# Patient Record
Sex: Female | Born: 1935 | Race: White | Hispanic: No | Smoking: Never smoker
Health system: Southern US, Community
[De-identification: ages and names within clinical notes are randomized; demographics above are authoritative.]

## PROBLEM LIST (undated history)

## (undated) DIAGNOSIS — I10 Essential (primary) hypertension: Secondary | ICD-10-CM

## (undated) DIAGNOSIS — N289 Disorder of kidney and ureter, unspecified: Secondary | ICD-10-CM

## (undated) DIAGNOSIS — G2581 Restless legs syndrome: Secondary | ICD-10-CM

## (undated) DIAGNOSIS — D649 Anemia, unspecified: Secondary | ICD-10-CM

## (undated) DIAGNOSIS — I509 Heart failure, unspecified: Secondary | ICD-10-CM

---

## 2014-09-19 ENCOUNTER — Other Ambulatory Visit
Admission: RE | Admit: 2014-09-19 | Discharge: 2014-09-19 | Disposition: A | Discharge status: Home / Self Care | Payer: Medicare Other | Source: Ambulatory Visit | Attending: Physician Assistant | Admitting: Physician Assistant

## 2014-09-19 DIAGNOSIS — R300 Dysuria: Secondary | ICD-10-CM | POA: Insufficient documentation

## 2014-09-19 LAB — URINALYSIS COMPLETE WITH MICROSCOPIC (ARMC ONLY)
Bilirubin Urine: NEGATIVE
GLUCOSE, UA: NEGATIVE mg/dL
HGB URINE DIPSTICK: NEGATIVE
Ketones, ur: NEGATIVE mg/dL
LEUKOCYTES UA: NEGATIVE
NITRITE: NEGATIVE
Protein, ur: 100 mg/dL — AB
SPECIFIC GRAVITY, URINE: 1.01 (ref 1.005–1.030)
pH: 5 (ref 5.0–8.0)

## 2014-11-28 ENCOUNTER — Other Ambulatory Visit
Admission: RE | Admit: 2014-11-28 | Discharge: 2014-11-28 | Disposition: A | Discharge status: Home / Self Care | Payer: Medicare Other | Source: Ambulatory Visit | Attending: Nurse Practitioner | Admitting: Nurse Practitioner

## 2014-11-28 DIAGNOSIS — R4182 Altered mental status, unspecified: Secondary | ICD-10-CM | POA: Diagnosis present

## 2014-11-28 LAB — CBC WITH DIFFERENTIAL/PLATELET
BASOS ABS: 0.1 10*3/uL (ref 0–0.1)
Basophils Relative: 1 %
Eosinophils Absolute: 0.4 10*3/uL (ref 0–0.7)
Eosinophils Relative: 4 %
HCT: 34.2 % — ABNORMAL LOW (ref 35.0–47.0)
Hemoglobin: 11.4 g/dL — ABNORMAL LOW (ref 12.0–16.0)
LYMPHS ABS: 2.4 10*3/uL (ref 1.0–3.6)
LYMPHS PCT: 25 %
MCH: 31.9 pg (ref 26.0–34.0)
MCHC: 33.3 g/dL (ref 32.0–36.0)
MCV: 95.8 fL (ref 80.0–100.0)
Monocytes Absolute: 0.6 10*3/uL (ref 0.2–0.9)
Monocytes Relative: 6 %
NEUTROS PCT: 64 %
Neutro Abs: 6.2 10*3/uL (ref 1.4–6.5)
PLATELETS: 249 10*3/uL (ref 150–440)
RBC: 3.57 MIL/uL — ABNORMAL LOW (ref 3.80–5.20)
RDW: 17.9 % — AB (ref 11.5–14.5)
WBC: 9.7 10*3/uL (ref 3.6–11.0)

## 2014-11-28 LAB — COMPREHENSIVE METABOLIC PANEL
ALBUMIN: 4 g/dL (ref 3.5–5.0)
ALT: 19 U/L (ref 14–54)
ANION GAP: 12 (ref 5–15)
AST: 31 U/L (ref 15–41)
Alkaline Phosphatase: 69 U/L (ref 38–126)
BUN: 48 mg/dL — ABNORMAL HIGH (ref 6–20)
CO2: 27 mmol/L (ref 22–32)
Calcium: 9 mg/dL (ref 8.9–10.3)
Chloride: 95 mmol/L — ABNORMAL LOW (ref 101–111)
Creatinine, Ser: 2.09 mg/dL — ABNORMAL HIGH (ref 0.44–1.00)
GFR calc Af Amer: 23 mL/min — ABNORMAL LOW (ref >60)
GFR calc non Af Amer: 19 mL/min — ABNORMAL LOW (ref >60)
GLUCOSE: 101 mg/dL — AB (ref 65–99)
Potassium: 4.4 mmol/L (ref 3.5–5.1)
SODIUM: 134 mmol/L — AB (ref 135–145)
Total Bilirubin: 0.4 mg/dL (ref 0.3–1.2)
Total Protein: 7.7 g/dL (ref 6.5–8.1)

## 2014-12-04 ENCOUNTER — Other Ambulatory Visit
Admission: RE | Admit: 2014-12-04 | Discharge: 2014-12-04 | Disposition: A | Discharge status: Home / Self Care | Payer: Medicare Other | Source: Ambulatory Visit | Attending: Nurse Practitioner | Admitting: Nurse Practitioner

## 2014-12-04 DIAGNOSIS — R4182 Altered mental status, unspecified: Secondary | ICD-10-CM | POA: Diagnosis present

## 2014-12-04 LAB — URINALYSIS COMPLETE WITH MICROSCOPIC (ARMC ONLY)
Bilirubin Urine: NEGATIVE
Glucose, UA: NEGATIVE mg/dL
Ketones, ur: NEGATIVE mg/dL
Nitrite: NEGATIVE
Protein, ur: >300 mg/dL — AB
SQUAMOUS EPITHELIAL / LPF: NONE SEEN — AB
Specific Gravity, Urine: 1.025 (ref 1.005–1.030)
pH: 5.5 (ref 5.0–8.0)

## 2014-12-06 LAB — URINE CULTURE: Culture: >=100000

## 2014-12-12 ENCOUNTER — Other Ambulatory Visit
Admission: RE | Admit: 2014-12-12 | Discharge: 2014-12-12 | Disposition: A | Discharge status: Home / Self Care | Payer: BLUE CROSS/BLUE SHIELD | Source: Ambulatory Visit | Attending: Nurse Practitioner | Admitting: Nurse Practitioner

## 2014-12-12 DIAGNOSIS — I509 Heart failure, unspecified: Secondary | ICD-10-CM | POA: Insufficient documentation

## 2014-12-12 LAB — CBC WITH DIFFERENTIAL/PLATELET
Basophils Absolute: 0.1 10*3/uL (ref 0–0.1)
Basophils Relative: 1 %
EOS PCT: 1 %
Eosinophils Absolute: 0.1 10*3/uL (ref 0–0.7)
HEMATOCRIT: 34.3 % — AB (ref 35.0–47.0)
HEMOGLOBIN: 11.6 g/dL — AB (ref 12.0–16.0)
LYMPHS ABS: 1.6 10*3/uL (ref 1.0–3.6)
Lymphocytes Relative: 17 %
MCH: 32.6 pg (ref 26.0–34.0)
MCHC: 34 g/dL (ref 32.0–36.0)
MCV: 96.1 fL (ref 80.0–100.0)
Monocytes Absolute: 0.5 10*3/uL (ref 0.2–0.9)
Monocytes Relative: 6 %
NEUTROS ABS: 7 10*3/uL — AB (ref 1.4–6.5)
Neutrophils Relative %: 75 %
Platelets: 244 10*3/uL (ref 150–440)
RBC: 3.57 MIL/uL — ABNORMAL LOW (ref 3.80–5.20)
RDW: 16.7 % — ABNORMAL HIGH (ref 11.5–14.5)
WBC: 9.3 10*3/uL (ref 3.6–11.0)

## 2014-12-12 LAB — COMPREHENSIVE METABOLIC PANEL
ALT: 16 U/L (ref 14–54)
AST: 29 U/L (ref 15–41)
Albumin: 4.1 g/dL (ref 3.5–5.0)
Alkaline Phosphatase: 62 U/L (ref 38–126)
Anion gap: 10 (ref 5–15)
BUN: 52 mg/dL — ABNORMAL HIGH (ref 6–20)
CO2: 27 mmol/L (ref 22–32)
CREATININE: 2.21 mg/dL — AB (ref 0.44–1.00)
Calcium: 9.1 mg/dL (ref 8.9–10.3)
Chloride: 99 mmol/L — ABNORMAL LOW (ref 101–111)
GFR calc Af Amer: 21 mL/min — ABNORMAL LOW (ref >60)
GFR calc non Af Amer: 18 mL/min — ABNORMAL LOW (ref >60)
Glucose, Bld: 98 mg/dL (ref 65–99)
POTASSIUM: 3.9 mmol/L (ref 3.5–5.1)
SODIUM: 136 mmol/L (ref 135–145)
TOTAL PROTEIN: 7.8 g/dL (ref 6.5–8.1)
Total Bilirubin: 0.7 mg/dL (ref 0.3–1.2)

## 2014-12-15 ENCOUNTER — Encounter: Payer: Self-pay | Admitting: Emergency Medicine

## 2014-12-15 ENCOUNTER — Emergency Department
Admission: EM | Admit: 2014-12-15 | Discharge: 2014-12-15 | Disposition: A | Discharge status: Home / Self Care | Payer: Medicare Other | Attending: Student | Admitting: Student

## 2014-12-15 ENCOUNTER — Emergency Department: Payer: Medicare Other

## 2014-12-15 ENCOUNTER — Other Ambulatory Visit: Payer: Self-pay

## 2014-12-15 DIAGNOSIS — E860 Dehydration: Secondary | ICD-10-CM | POA: Diagnosis not present

## 2014-12-15 DIAGNOSIS — R001 Bradycardia, unspecified: Secondary | ICD-10-CM | POA: Insufficient documentation

## 2014-12-15 DIAGNOSIS — N183 Chronic kidney disease, stage 3 (moderate): Secondary | ICD-10-CM | POA: Insufficient documentation

## 2014-12-15 DIAGNOSIS — I129 Hypertensive chronic kidney disease with stage 1 through stage 4 chronic kidney disease, or unspecified chronic kidney disease: Secondary | ICD-10-CM | POA: Insufficient documentation

## 2014-12-15 DIAGNOSIS — R4182 Altered mental status, unspecified: Secondary | ICD-10-CM | POA: Diagnosis present

## 2014-12-15 HISTORY — DX: Anemia, unspecified: D64.9

## 2014-12-15 HISTORY — DX: Restless legs syndrome: G25.81

## 2014-12-15 HISTORY — DX: Heart failure, unspecified: I50.9

## 2014-12-15 HISTORY — DX: Essential (primary) hypertension: I10

## 2014-12-15 HISTORY — DX: Disorder of kidney and ureter, unspecified: N28.9

## 2014-12-15 LAB — CBC WITH DIFFERENTIAL/PLATELET
BASOS PCT: 1 %
Basophils Absolute: 0 10*3/uL (ref 0–0.1)
EOS ABS: 0.3 10*3/uL (ref 0–0.7)
Eosinophils Relative: 4 %
HCT: 34.7 % — ABNORMAL LOW (ref 35.0–47.0)
Hemoglobin: 11.7 g/dL — ABNORMAL LOW (ref 12.0–16.0)
LYMPHS ABS: 2.3 10*3/uL (ref 1.0–3.6)
Lymphocytes Relative: 29 %
MCH: 32.5 pg (ref 26.0–34.0)
MCHC: 33.8 g/dL (ref 32.0–36.0)
MCV: 96.2 fL (ref 80.0–100.0)
MONOS PCT: 8 %
Monocytes Absolute: 0.7 10*3/uL (ref 0.2–0.9)
Neutro Abs: 4.7 10*3/uL (ref 1.4–6.5)
Neutrophils Relative %: 58 %
Platelets: 232 10*3/uL (ref 150–440)
RBC: 3.61 MIL/uL — ABNORMAL LOW (ref 3.80–5.20)
RDW: 17.2 % — ABNORMAL HIGH (ref 11.5–14.5)
WBC: 8 10*3/uL (ref 3.6–11.0)

## 2014-12-15 LAB — COMPREHENSIVE METABOLIC PANEL
ALK PHOS: 64 U/L (ref 38–126)
ALT: 18 U/L (ref 14–54)
AST: 38 U/L (ref 15–41)
Albumin: 4.1 g/dL (ref 3.5–5.0)
Anion gap: 10 (ref 5–15)
BUN: 54 mg/dL — ABNORMAL HIGH (ref 6–20)
CO2: 24 mmol/L (ref 22–32)
CREATININE: 2.35 mg/dL — AB (ref 0.44–1.00)
Calcium: 9.3 mg/dL (ref 8.9–10.3)
Chloride: 97 mmol/L — ABNORMAL LOW (ref 101–111)
GFR calc Af Amer: 20 mL/min — ABNORMAL LOW (ref >60)
GFR, EST NON AFRICAN AMERICAN: 17 mL/min — AB (ref >60)
Glucose, Bld: 97 mg/dL (ref 65–99)
POTASSIUM: 3.5 mmol/L (ref 3.5–5.1)
Sodium: 131 mmol/L — ABNORMAL LOW (ref 135–145)
TOTAL PROTEIN: 7.7 g/dL (ref 6.5–8.1)
Total Bilirubin: 0.2 mg/dL — ABNORMAL LOW (ref 0.3–1.2)

## 2014-12-15 LAB — URINALYSIS COMPLETE WITH MICROSCOPIC (ARMC ONLY)
Bilirubin Urine: NEGATIVE
Glucose, UA: NEGATIVE mg/dL
HGB URINE DIPSTICK: NEGATIVE
Ketones, ur: NEGATIVE mg/dL
Nitrite: NEGATIVE
Protein, ur: 30 mg/dL — AB
RBC / HPF: NONE SEEN RBC/hpf (ref 0–5)
SPECIFIC GRAVITY, URINE: 1.006 (ref 1.005–1.030)
pH: 5 (ref 5.0–8.0)

## 2014-12-15 LAB — TROPONIN I

## 2014-12-15 MED ORDER — SODIUM CHLORIDE 0.9 % IV BOLUS (SEPSIS)
250.0000 mL | Freq: Once | INTRAVENOUS | Status: AC
  Administered 2014-12-15: 250 mL via INTRAVENOUS

## 2014-12-15 NOTE — ED Notes (Signed)
Pt comes in via EMS c/o altered mental status x 1 week.  Patient is from [place].  They explained that she was being treated for a UTI last week and has been on antibiotics.  Facility drew blood work earlier this week and per EMS they stated "she was dry".  Patient A&O to person and time.

## 2014-12-15 NOTE — ED Notes (Signed)
MD at bedside. 

## 2014-12-15 NOTE — ED Notes (Signed)
Patient transported to X-ray 

## 2014-12-15 NOTE — ED Provider Notes (Addendum)
[facility] Emergency Department Provider Note  ____________________________________________  Time seen: Approximately 5:04 PM  I have reviewed the triage vital signs and the nursing notes.   HISTORY  Chief Complaint Altered Mental Status  Caveat-history of present illness and review of systems Limited secondary to the patient not knowing why she was sent to the emergency department from [facility]. Partial history and review of systems obtained from EMS on their arrival and staff at [facility].  HPI [name] [name] is a 81 y.o. female with history of anemia, hypertension, CHF, stage III CKD, restless leg syndrome resents from [place] [facility] for evaluation of "confusion" and "altered mental status". Per EMS report, the patient was recently treated for urinary tract infection last week with ciprofloxacin which she is still taking. Staff reports to me that the patient's physician came in to check on her today injury labs and that she "looked really dry on her lab work". Also reports that the patient was repeating "I'm all right" today and that she has been somewhat irritable recently not wanting to take her iron pills because "I don't need it". She has had no fevers, no chills, no vomiting,  diarrhea, cough, or recent fevers. No recent fall. Denies any chest pain or difficulty breathing. She did not want to come to the emergency department today.   Past Medical History  Diagnosis Date  . Anemia   . Restless leg syndrome   . Hypertension   . CHF (congestive heart failure)   . Renal disorder     CKD stage 3    There are no active problems to display for this patient.   History reviewed. No pertinent past surgical history.  No current outpatient prescriptions on file.  Allergies Codeine  No family history on file.  Social History History  Substance Use Topics  . Smoking status: Never Smoker   . Smokeless tobacco: Not on file  . Alcohol Use: No     Review of Systems Constitutional: No fever/chills Eyes: No visual changes. ENT: No sore throat. Cardiovascular: Denies chest pain. Respiratory: Denies shortness of breath. Gastrointestinal: No abdominal pain.  No nausea, no vomiting.  No diarrhea.  No constipation. Genitourinary: Negative for dysuria. Musculoskeletal: Negative for back pain. Skin: Negative for rash. Neurological: Negative for headaches, focal weakness or numbness.  10-point ROS otherwise negative.   Caveat-history of present illness and review of systems Limited secondary to the patient not knowing why she was sent to the emergency department from [facility]. Partial history and review of systems obtained from EMS on their arrival. ____________________________________________   PHYSICAL EXAM:  VITAL SIGNS: ED Triage Vitals  Enc Vitals Group     BP --      Pulse Rate 12/15/14 1659 54     Resp 12/15/14 1659 12     Temp 12/15/14 1659 97.6 F (36.4 C)     Temp Source 12/15/14 1659 Oral     SpO2 12/15/14 1659 100 %     Weight 12/15/14 1659 178 lb 14.4 oz (81.149 kg)     Height 12/15/14 1659 5\' 4"  (1.626 m)     Head Cir --      Peak Flow --      Pain Score --      Pain Loc --      Pain Edu? --      Excl. in GC? --     Constitutional: Alert and oriented x3. Well appearing and in no acute distress. Eyes: Conjunctivae  are normal. PERRL. EOMI. Head: Atraumatic. Nose: No congestion/rhinnorhea. Mouth/Throat: Mucous membranes are moist.  Oropharynx non-erythematous. Neck: No stridor.   Cardiovascular: mildly bradycardic rate, regular rhythm. Grossly normal heart sounds.  Good peripheral circulation. Respiratory: Normal respiratory effort.  No retractions. Lungs CTAB. Gastrointestinal: Soft and nontender. No distention. No abdominal bruits. No CVA tenderness. Genitourinary: deferred Musculoskeletal: No lower extremity tenderness nor edema.  No joint effusions. Neurologic:  Normal speech and language. No  gross focal neurologic deficits are appreciated. 5 out of 5 strength in bilateral upper and lower extremities, sensation intact to light touch throughout. Skin:  Skin is warm, dry and intact. No rash noted. Psychiatric: Mood and affect are normal. Speech and behavior are normal.  ____________________________________________   LABS (all labs ordered are listed, but only abnormal results are displayed)  Labs Reviewed  CBC WITH DIFFERENTIAL/PLATELET - Abnormal; Notable for the following:    RBC 3.61 (*)    Hemoglobin 11.7 (*)    HCT 34.7 (*)    RDW 17.2 (*)    All other components within normal limits  COMPREHENSIVE METABOLIC PANEL - Abnormal; Notable for the following:    Sodium 131 (*)    Chloride 97 (*)    BUN 54 (*)    Creatinine, Ser 2.35 (*)    Total Bilirubin 0.2 (*)    GFR calc non Af Amer 17 (*)    GFR calc Af Amer 20 (*)    All other components within normal limits  URINALYSIS COMPLETEWITH MICROSCOPIC ([facility] ONLY) - Abnormal; Notable for the following:    Color, Urine STRAW (*)    APPearance CLEAR (*)    Protein, ur 30 (*)    Leukocytes, UA TRACE (*)    Bacteria, UA RARE (*)    Squamous Epithelial / LPF 0-5 (*)    All other components within normal limits  TROPONIN I   ____________________________________________  EKG  ED ECG REPORT I, Gayla Doss, the attending physician, personally viewed and interpreted this ECG.   Date: 12/15/2014  EKG Time: 16:47  Rate: 47  Rhythm: sinus bradycardia with sinus arrhythmia  Axis: normal  Intervals:left bundle branch block, incomplete  ST&T Change: No acute ST segment elevation  ____________________________________________  RADIOLOGY  CXR  FINDINGS: The lungs are clear. Heart size is normal. No pneumothorax or pleural effusion. Hiatal hernia is noted.  IMPRESSION: No acute disease.  Hiatal hernia.  ____________________________________________   PROCEDURES  Procedure(s) performed: None  Critical Care  performed: No  ____________________________________________   INITIAL IMPRESSION / ASSESSMENT AND PLAN / ED COURSE  Pertinent labs & imaging results that were available during my care of the patient were reviewed by me and considered in my medical decision making (see chart for details).  [name] [name] is a 81 y.o. female with history of anemia, hypertension, CHF, stage III CKD, restless leg syndrome resents from [place] [facility] for evaluation of "confusion" and "altered mental status". On arrival, the patient is alert and oriented 3. She is only confused because she was not told why she was being sent to the emergency department and she is a bit upset about having to come here. She is very well-appearing, with a benign exam, intact neurological examination. She is mildly bradycardic which I suspect is secondary to metoprolol which she is prescribed.. She is maintaining adequate blood pressure. Chest x-ray clear. Labs notable for mild stable anemia. She is mildly hyponatremic with sodium of 131. She has elevated BUN to creatinine ratio, BUN is 54, creatinine  2.35 but this is similar to values obtained 3 days ago and 2 weeks ago. Additionally, she has known stage III chronic kidney disease. She has a tenuous fluid status given her history of CHF so will give a tiny fluid bolus and anticipate discharge with PCP and cardiology follow-up as they may decide to down titrate her Lasix if she continues to have the above lab findings. urinalysis with trace leuk esterase, she is still on ciprofloxacin for urinary tract infection. We'll discharge home. ____________________________________________   FINAL CLINICAL IMPRESSION(S) / ED DIAGNOSES  Final diagnoses:  Dehydration      Gayla Doss, MD 12/15/14 1840  Gayla Doss, MD 12/15/14 1842

## 2014-12-30 ENCOUNTER — Other Ambulatory Visit
Admission: RE | Admit: 2014-12-30 | Discharge: 2014-12-30 | Disposition: A | Discharge status: Home / Self Care | Payer: Medicare Other | Source: Ambulatory Visit | Attending: Nurse Practitioner | Admitting: Nurse Practitioner

## 2014-12-30 DIAGNOSIS — I509 Heart failure, unspecified: Secondary | ICD-10-CM | POA: Diagnosis present

## 2014-12-30 LAB — COMPREHENSIVE METABOLIC PANEL
ALT: 14 U/L (ref 14–54)
AST: 27 U/L (ref 15–41)
Albumin: 3.8 g/dL (ref 3.5–5.0)
Alkaline Phosphatase: 59 U/L (ref 38–126)
Anion gap: 9 (ref 5–15)
BUN: 36 mg/dL — ABNORMAL HIGH (ref 6–20)
CHLORIDE: 99 mmol/L — AB (ref 101–111)
CO2: 28 mmol/L (ref 22–32)
CREATININE: 1.81 mg/dL — AB (ref 0.44–1.00)
Calcium: 9 mg/dL (ref 8.9–10.3)
GFR calc Af Amer: 27 mL/min — ABNORMAL LOW (ref >60)
GFR, EST NON AFRICAN AMERICAN: 23 mL/min — AB (ref >60)
Glucose, Bld: 87 mg/dL (ref 65–99)
POTASSIUM: 4.5 mmol/L (ref 3.5–5.1)
SODIUM: 136 mmol/L (ref 135–145)
Total Bilirubin: 0.5 mg/dL (ref 0.3–1.2)
Total Protein: 7.3 g/dL (ref 6.5–8.1)

## 2014-12-30 LAB — CBC
HCT: 32.4 % — ABNORMAL LOW (ref 35.0–47.0)
Hemoglobin: 11 g/dL — ABNORMAL LOW (ref 12.0–16.0)
MCH: 33.1 pg (ref 26.0–34.0)
MCHC: 33.8 g/dL (ref 32.0–36.0)
MCV: 98 fL (ref 80.0–100.0)
PLATELETS: 211 10*3/uL (ref 150–440)
RBC: 3.31 MIL/uL — ABNORMAL LOW (ref 3.80–5.20)
RDW: 16.6 % — AB (ref 11.5–14.5)
WBC: 7.6 10*3/uL (ref 3.6–11.0)

## 2014-12-30 LAB — TSH

## 2015-01-13 ENCOUNTER — Other Ambulatory Visit
Admission: RE | Admit: 2015-01-13 | Discharge: 2015-01-13 | Disposition: A | Discharge status: Home / Self Care | Payer: Medicare Other | Source: Ambulatory Visit | Attending: Nurse Practitioner | Admitting: Nurse Practitioner

## 2015-01-13 DIAGNOSIS — Z0000 Encounter for general adult medical examination without abnormal findings: Secondary | ICD-10-CM | POA: Insufficient documentation

## 2015-01-13 LAB — COMPREHENSIVE METABOLIC PANEL
ALK PHOS: 69 U/L (ref 38–126)
ALT: 14 U/L (ref 14–54)
AST: 32 U/L (ref 15–41)
Albumin: 4.2 g/dL (ref 3.5–5.0)
Anion gap: 12 (ref 5–15)
BUN: 29 mg/dL — AB (ref 6–20)
CALCIUM: 9.6 mg/dL (ref 8.9–10.3)
CHLORIDE: 100 mmol/L — AB (ref 101–111)
CO2: 24 mmol/L (ref 22–32)
CREATININE: 1.53 mg/dL — AB (ref 0.44–1.00)
GFR, EST AFRICAN AMERICAN: 33 mL/min — AB (ref >60)
GFR, EST NON AFRICAN AMERICAN: 28 mL/min — AB (ref >60)
Glucose, Bld: 94 mg/dL (ref 65–99)
Potassium: 3.9 mmol/L (ref 3.5–5.1)
Sodium: 136 mmol/L (ref 135–145)
Total Bilirubin: 0.5 mg/dL (ref 0.3–1.2)
Total Protein: 8 g/dL (ref 6.5–8.1)

## 2015-01-13 LAB — CBC
HCT: 35.6 % (ref 35.0–47.0)
Hemoglobin: 11.7 g/dL — ABNORMAL LOW (ref 12.0–16.0)
MCH: 32.6 pg (ref 26.0–34.0)
MCHC: 32.8 g/dL (ref 32.0–36.0)
MCV: 99.4 fL (ref 80.0–100.0)
Platelets: 244 10*3/uL (ref 150–440)
RBC: 3.58 MIL/uL — AB (ref 3.80–5.20)
RDW: 16.4 % — ABNORMAL HIGH (ref 11.5–14.5)
WBC: 9.1 10*3/uL (ref 3.6–11.0)

## 2015-01-13 LAB — TSH: TSH: >90 u[IU]/mL — ABNORMAL HIGH (ref 0.350–4.500)

## 2015-02-17 ENCOUNTER — Other Ambulatory Visit
Admission: RE | Admit: 2015-02-17 | Discharge: 2015-02-17 | Disposition: A | Discharge status: Home / Self Care | Payer: Medicare Other | Source: Ambulatory Visit | Attending: Nurse Practitioner | Admitting: Nurse Practitioner

## 2015-02-17 DIAGNOSIS — Z029 Encounter for administrative examinations, unspecified: Secondary | ICD-10-CM | POA: Diagnosis present

## 2015-02-17 LAB — TSH: TSH: 84.425 u[IU]/mL — AB (ref 0.350–4.500)

## 2015-02-17 LAB — BASIC METABOLIC PANEL
Anion gap: 10 (ref 5–15)
BUN: 28 mg/dL — ABNORMAL HIGH (ref 6–20)
CHLORIDE: 98 mmol/L — AB (ref 101–111)
CO2: 28 mmol/L (ref 22–32)
Calcium: 9.5 mg/dL (ref 8.9–10.3)
Creatinine, Ser: 1.83 mg/dL — ABNORMAL HIGH (ref 0.44–1.00)
GFR calc non Af Amer: 23 mL/min — ABNORMAL LOW (ref >60)
GFR, EST AFRICAN AMERICAN: 26 mL/min — AB (ref >60)
Glucose, Bld: 105 mg/dL — ABNORMAL HIGH (ref 65–99)
POTASSIUM: 4.3 mmol/L (ref 3.5–5.1)
Sodium: 136 mmol/L (ref 135–145)

## 2015-02-18 ENCOUNTER — Other Ambulatory Visit
Admission: RE | Admit: 2015-02-18 | Discharge: 2015-02-18 | Disposition: A | Discharge status: Home / Self Care | Payer: Medicare Other | Source: Ambulatory Visit | Attending: Nurse Practitioner | Admitting: Nurse Practitioner

## 2015-02-18 DIAGNOSIS — R410 Disorientation, unspecified: Secondary | ICD-10-CM | POA: Insufficient documentation

## 2015-02-18 LAB — URINALYSIS COMPLETE WITH MICROSCOPIC (ARMC ONLY)
BILIRUBIN URINE: NEGATIVE
Bacteria, UA: NONE SEEN — AB
GLUCOSE, UA: NEGATIVE mg/dL
KETONES UR: NEGATIVE mg/dL
LEUKOCYTES UA: NEGATIVE
NITRITE: NEGATIVE
Protein, ur: 100 mg/dL — AB
SPECIFIC GRAVITY, URINE: 1.02 (ref 1.005–1.030)
pH: 5 (ref 5.0–8.0)

## 2015-03-17 ENCOUNTER — Other Ambulatory Visit: Admission: RE | Admit: 2015-03-17 | Payer: Medicare Other | Source: Ambulatory Visit | Admitting: *Deleted

## 2015-04-28 ENCOUNTER — Other Ambulatory Visit
Admission: RE | Admit: 2015-04-28 | Discharge: 2015-04-28 | Disposition: A | Discharge status: Home / Self Care | Payer: Medicare Other | Source: Ambulatory Visit | Attending: Nurse Practitioner | Admitting: Nurse Practitioner

## 2015-04-28 DIAGNOSIS — E039 Hypothyroidism, unspecified: Secondary | ICD-10-CM | POA: Insufficient documentation

## 2015-04-28 DIAGNOSIS — I10 Essential (primary) hypertension: Secondary | ICD-10-CM | POA: Diagnosis not present

## 2015-04-28 DIAGNOSIS — D649 Anemia, unspecified: Secondary | ICD-10-CM | POA: Insufficient documentation

## 2015-04-28 LAB — COMPREHENSIVE METABOLIC PANEL
ALT: 10 U/L — ABNORMAL LOW (ref 14–54)
ANION GAP: 5 (ref 5–15)
AST: 13 U/L — AB (ref 15–41)
Albumin: 3.9 g/dL (ref 3.5–5.0)
Alkaline Phosphatase: 63 U/L (ref 38–126)
BUN: 37 mg/dL — AB (ref 6–20)
CHLORIDE: 109 mmol/L (ref 101–111)
CO2: 26 mmol/L (ref 22–32)
Calcium: 8.9 mg/dL (ref 8.9–10.3)
Creatinine, Ser: 1.46 mg/dL — ABNORMAL HIGH (ref 0.44–1.00)
GFR calc non Af Amer: 30 mL/min — ABNORMAL LOW (ref >60)
GFR, EST AFRICAN AMERICAN: 35 mL/min — AB (ref >60)
Glucose, Bld: 101 mg/dL — ABNORMAL HIGH (ref 65–99)
POTASSIUM: 4.6 mmol/L (ref 3.5–5.1)
Sodium: 140 mmol/L (ref 135–145)
Total Bilirubin: 0.3 mg/dL (ref 0.3–1.2)
Total Protein: 6.9 g/dL (ref 6.5–8.1)

## 2015-04-28 LAB — CBC
HEMATOCRIT: 32.7 % — AB (ref 35.0–47.0)
HEMOGLOBIN: 10.9 g/dL — AB (ref 12.0–16.0)
MCH: 32.5 pg (ref 26.0–34.0)
MCHC: 33.3 g/dL (ref 32.0–36.0)
MCV: 97.7 fL (ref 80.0–100.0)
Platelets: 220 10*3/uL (ref 150–440)
RBC: 3.34 MIL/uL — AB (ref 3.80–5.20)
RDW: 14 % (ref 11.5–14.5)
WBC: 8.1 10*3/uL (ref 3.6–11.0)

## 2015-04-28 LAB — TSH: TSH: 12.947 u[IU]/mL — ABNORMAL HIGH (ref 0.350–4.500)

## 2015-05-20 ENCOUNTER — Other Ambulatory Visit
Admission: RE | Admit: 2015-05-20 | Discharge: 2015-05-20 | Disposition: A | Discharge status: Home / Self Care | Payer: Medicare Other | Source: Ambulatory Visit | Attending: Nurse Practitioner | Admitting: Nurse Practitioner

## 2015-05-20 DIAGNOSIS — R7989 Other specified abnormal findings of blood chemistry: Secondary | ICD-10-CM | POA: Diagnosis present

## 2015-05-20 DIAGNOSIS — R946 Abnormal results of thyroid function studies: Secondary | ICD-10-CM | POA: Insufficient documentation

## 2015-05-20 DIAGNOSIS — R6889 Other general symptoms and signs: Secondary | ICD-10-CM | POA: Insufficient documentation

## 2015-05-20 LAB — COMPREHENSIVE METABOLIC PANEL
ALBUMIN: 3.6 g/dL (ref 3.5–5.0)
ALK PHOS: 65 U/L (ref 38–126)
ALT: 10 U/L — AB (ref 14–54)
AST: 14 U/L — AB (ref 15–41)
Anion gap: 8 (ref 5–15)
BUN: 46 mg/dL — AB (ref 6–20)
CALCIUM: 8.9 mg/dL (ref 8.9–10.3)
CO2: 24 mmol/L (ref 22–32)
CREATININE: 1.47 mg/dL — AB (ref 0.44–1.00)
Chloride: 107 mmol/L (ref 101–111)
GFR calc non Af Amer: 30 mL/min — ABNORMAL LOW (ref >60)
GFR, EST AFRICAN AMERICAN: 34 mL/min — AB (ref >60)
GLUCOSE: 110 mg/dL — AB (ref 65–99)
Potassium: 4.8 mmol/L (ref 3.5–5.1)
SODIUM: 139 mmol/L (ref 135–145)
Total Bilirubin: 0.5 mg/dL (ref 0.3–1.2)
Total Protein: 6.7 g/dL (ref 6.5–8.1)

## 2015-05-20 LAB — CBC
HCT: 31.9 % — ABNORMAL LOW (ref 35.0–47.0)
HEMOGLOBIN: 10.4 g/dL — AB (ref 12.0–16.0)
MCH: 32 pg (ref 26.0–34.0)
MCHC: 32.7 g/dL (ref 32.0–36.0)
MCV: 97.9 fL (ref 80.0–100.0)
Platelets: 227 10*3/uL (ref 150–440)
RBC: 3.26 MIL/uL — AB (ref 3.80–5.20)
RDW: 14.1 % (ref 11.5–14.5)
WBC: 8.3 10*3/uL (ref 3.6–11.0)

## 2015-05-20 LAB — TSH: TSH: 6.195 u[IU]/mL — ABNORMAL HIGH (ref 0.350–4.500)

## 2015-06-02 ENCOUNTER — Emergency Department
Admission: EM | Admit: 2015-06-02 | Discharge: 2015-06-02 | Disposition: A | Discharge status: Home / Self Care | Payer: Medicare Other | Attending: Emergency Medicine | Admitting: Emergency Medicine

## 2015-06-02 ENCOUNTER — Emergency Department: Payer: Medicare Other

## 2015-06-02 ENCOUNTER — Encounter: Payer: Self-pay | Admitting: *Deleted

## 2015-06-02 DIAGNOSIS — Z4801 Encounter for change or removal of surgical wound dressing: Secondary | ICD-10-CM | POA: Diagnosis present

## 2015-06-02 DIAGNOSIS — Y9389 Activity, other specified: Secondary | ICD-10-CM | POA: Diagnosis not present

## 2015-06-02 DIAGNOSIS — I129 Hypertensive chronic kidney disease with stage 1 through stage 4 chronic kidney disease, or unspecified chronic kidney disease: Secondary | ICD-10-CM | POA: Insufficient documentation

## 2015-06-02 DIAGNOSIS — Y9289 Other specified places as the place of occurrence of the external cause: Secondary | ICD-10-CM | POA: Diagnosis not present

## 2015-06-02 DIAGNOSIS — N183 Chronic kidney disease, stage 3 (moderate): Secondary | ICD-10-CM | POA: Diagnosis not present

## 2015-06-02 DIAGNOSIS — S81801A Unspecified open wound, right lower leg, initial encounter: Secondary | ICD-10-CM | POA: Insufficient documentation

## 2015-06-02 DIAGNOSIS — X58XXXA Exposure to other specified factors, initial encounter: Secondary | ICD-10-CM | POA: Diagnosis not present

## 2015-06-02 DIAGNOSIS — Y998 Other external cause status: Secondary | ICD-10-CM | POA: Diagnosis not present

## 2015-06-02 LAB — CBC WITH DIFFERENTIAL/PLATELET
BASOS PCT: 1 %
Basophils Absolute: 0 10*3/uL (ref 0–0.1)
EOS ABS: 0.3 10*3/uL (ref 0–0.7)
Eosinophils Relative: 4 %
HCT: 31.6 % — ABNORMAL LOW (ref 35.0–47.0)
Hemoglobin: 10.4 g/dL — ABNORMAL LOW (ref 12.0–16.0)
Lymphocytes Relative: 20 %
Lymphs Abs: 1.6 10*3/uL (ref 1.0–3.6)
MCH: 31.5 pg (ref 26.0–34.0)
MCHC: 32.9 g/dL (ref 32.0–36.0)
MCV: 95.8 fL (ref 80.0–100.0)
MONO ABS: 0.5 10*3/uL (ref 0.2–0.9)
MONOS PCT: 6 %
Neutro Abs: 5.6 10*3/uL (ref 1.4–6.5)
Neutrophils Relative %: 69 %
PLATELETS: 241 10*3/uL (ref 150–440)
RBC: 3.3 MIL/uL — ABNORMAL LOW (ref 3.80–5.20)
RDW: 14.1 % (ref 11.5–14.5)
WBC: 8.2 10*3/uL (ref 3.6–11.0)

## 2015-06-02 LAB — BASIC METABOLIC PANEL
ANION GAP: 7 (ref 5–15)
BUN: 41 mg/dL — ABNORMAL HIGH (ref 6–20)
CALCIUM: 9.1 mg/dL (ref 8.9–10.3)
CO2: 23 mmol/L (ref 22–32)
Chloride: 111 mmol/L (ref 101–111)
Creatinine, Ser: 1.25 mg/dL — ABNORMAL HIGH (ref 0.44–1.00)
GFR, EST AFRICAN AMERICAN: 42 mL/min — AB (ref >60)
GFR, EST NON AFRICAN AMERICAN: 36 mL/min — AB (ref >60)
Glucose, Bld: 97 mg/dL (ref 65–99)
POTASSIUM: 4.8 mmol/L (ref 3.5–5.1)
SODIUM: 141 mmol/L (ref 135–145)

## 2015-06-02 MED ORDER — CEPHALEXIN 500 MG PO CAPS: 500.0000 mg | ORAL_CAPSULE | Freq: Four times a day (QID) | ORAL | Status: AC

## 2015-06-02 NOTE — ED Notes (Addendum)
Pt has been seen and treated at home by [facility] for a right lower leg wound, daughter reports wound has been getting worse with drainage, pt lives at [facility]

## 2015-06-02 NOTE — ED Notes (Signed)
Xeroform applied to wound and then covered with roller gauze.

## 2015-06-02 NOTE — Discharge Instructions (Signed)
Please seek medical attention for any high fevers, chest pain, shortness of breath, change in behavior, persistent vomiting, bloody stool or any other new or concerning symptoms. ° °Wound Care °Taking care of your wound properly can help to prevent pain and infection. It can also help your wound to heal more quickly.  °HOW TO CARE FOR YOUR WOUND  °· Take or apply over-the-counter and prescription medicines only as told by your health care provider. °· If you were prescribed antibiotic medicine, take or apply it as told by your health care provider. Do not stop using the antibiotic even if your condition improves. °· Clean the wound each day or as told by your health care provider. °¨ Wash the wound with mild soap and water. °¨ Rinse the wound with water to remove all soap. °¨ Pat the wound dry with a clean towel. Do not rub it. °· There are many different ways to close and cover a wound. For example, a wound can be covered with stitches (sutures), skin glue, or adhesive strips. Follow instructions from your health care provider about: °¨ How to take care of your wound. °¨ When and how you should change your bandage (dressing). °¨ When you should remove your dressing. °¨ Removing whatever was used to close your wound. °· Check your wound every day for signs of infection. Watch for: °¨ Redness, swelling, or pain. °¨ Fluid, blood, or pus. °· Keep the dressing dry until your health care provider says it can be removed. Do not take baths, swim, use a hot tub, or do anything that would put your wound underwater until your health care provider approves. °· Raise (elevate) the injured area above the level of your heart while you are sitting or lying down. °· Do not scratch or pick at the wound. °· Keep all follow-up visits as told by your health care provider. This is important. °SEEK MEDICAL CARE IF: °· You received a tetanus shot and you have swelling, severe pain, redness, or bleeding at the injection site. °· You have a  fever. °· Your pain is not controlled with medicine. °· You have increased redness, swelling, or pain at the site of your wound. °· You have fluid, blood, or pus coming from your wound. °· You notice a bad smell coming from your wound or your dressing. °SEEK IMMEDIATE MEDICAL CARE IF: °· You have a red streak going away from your wound. °  °This information is not intended to replace advice given to you by your health care provider. Make sure you discuss any questions you have with your health care provider. °  °Document Released: 02/02/2008 Document Revised: 09/09/2014 Document Reviewed: 04/21/2014 °Elsevier Interactive Patient Education ©2016 [employer] ° °

## 2015-06-02 NOTE — ED Provider Notes (Signed)
[facility] Emergency Department Provider Note    ____________________________________________  Time seen: 1600  I have reviewed the triage vital signs and the nursing notes.   HISTORY  Chief Complaint Wound Check   History limited by: Not Limited   HPI [name] [name] is a 82 y.o. female who presents to the emergency department today brought in by daughter because of concern for wound to her right lower leg. It started four weeks ago. Unclear etiology but family wonders if it is related to the patient's restless leg syndrome. It has progressively gotten worse since then. There is some associated pain. The patient states she has also been having some surrounding erythema. They have been trying some topical wound care but have not been placed on any antibiotics. She denies any fevers. Denies any vomiting or nausea. Has had similar wounds in the past.    Past Medical History  Diagnosis Date  . Anemia   . Restless leg syndrome   . Hypertension   . CHF (congestive heart failure) (HCC)   . Renal disorder     CKD stage 3    There are no active problems to display for this patient.   History reviewed. No pertinent past surgical history.  No current outpatient prescriptions on file.  Allergies Codeine  No family history on file.  Social History Social History  Substance Use Topics  . Smoking status: Never Smoker   . Smokeless tobacco: None  . Alcohol Use: No    Review of Systems  Constitutional: Negative for fever. Cardiovascular: Negative for chest pain. Respiratory: Negative for shortness of breath. Gastrointestinal: Negative for abdominal pain, vomiting and diarrhea. Neurological: Negative for headaches, focal weakness or numbness.  10-point ROS otherwise negative.  ____________________________________________   PHYSICAL EXAM:  VITAL SIGNS: ED Triage Vitals  Enc Vitals Group     BP 06/02/15 1317 172/59 mmHg     Pulse Rate  06/02/15 1315 73     Resp 06/02/15 1315 20     Temp 06/02/15 1315 97.8 F (36.6 C)     Temp Source 06/02/15 1315 Oral     SpO2 06/02/15 1315 97 %     Weight 06/02/15 1315 171 lb (77.565 kg)     Height 06/02/15 1315  (1.575 m)     Head Cir --      Peak Flow --      Pain Score 06/02/15 1316 0   Constitutional: Alert and oriented. Well appearing and in no distress. Eyes: Conjunctivae are normal. PERRL. Normal extraocular movements. ENT   Head: Normocephalic and atraumatic.   Nose: No congestion/rhinnorhea.   Mouth/Throat: Mucous membranes are moist.   Neck: No stridor. Hematological/Lymphatic/Immunilogical: No cervical lymphadenopathy. Cardiovascular: Normal rate, regular rhythm.  No murmurs, rubs, or gallops. Respiratory: Normal respiratory effort without tachypnea nor retractions. Breath sounds are clear and equal bilaterally. No wheezes/rales/rhonchi. Gastrointestinal: Soft and nontender. No distention.  Genitourinary: Deferred Musculoskeletal: Normal range of motion in all extremities. No joint effusions.  No lower extremity tenderness nor edema. Neurologic:  Normal speech and language. No gross focal neurologic deficits are appreciated.  Skin:  Roughly 1 inch diameter wound to the right lower leg. Surrounding erythema. Mildly tender to palpation. No crepitus.  Psychiatric: Mood and affect are normal. Speech and behavior are normal. Patient exhibits appropriate insight and judgment.  ____________________________________________    LABS (pertinent positives/negatives)  Labs Reviewed  CBC WITH DIFFERENTIAL/PLATELET - Abnormal; Notable for the following:    RBC 3.30 (*)  Hemoglobin 10.4 (*)    HCT 31.6 (*)    All other components within normal limits  BASIC METABOLIC PANEL - Abnormal; Notable for the following:    BUN 41 (*)    Creatinine, Ser 1.25 (*)    GFR calc non Af Amer 36 (*)    GFR calc Af Amer 42 (*)    All other components within normal limits      ____________________________________________   EKG  None  ____________________________________________    RADIOLOGY Right tib/fib  IMPRESSION: 1. Once again ulceration medially along the distal calf, without gas tracking in the soft tissues or underlying osteomyelitis, an without foreign body. 2. Subcutaneous edema in the upper ankle. 3. Prominent bony demineralization.   ____________________________________________   PROCEDURES  Procedure(s) performed: None  Critical Care performed: No  ____________________________________________   INITIAL IMPRESSION / ASSESSMENT AND PLAN / ED COURSE  Pertinent labs & imaging results that were available during my care of the patient were reviewed by me and considered in my medical decision making (see chart for details).  Patient presents to the emergency department today because of concerns for right lower leg wound. On exam patient does have a roughly 1 inch diameter ulcer to the right lower leg with some surrounding erythema. No crepitus. X-ray does not show any concerning findings. Patient without any systemic signs of infection. Will plan on giving antibiotics for this cellulitis as well as wound care clinic follow-up  ____________________________________________   FINAL CLINICAL IMPRESSION(S) / ED DIAGNOSES  Final diagnoses:  Open wound of right lower leg, initial encounter     Phineas Semen, MD 06/02/15 1622

## 2015-06-16 ENCOUNTER — Ambulatory Visit: Payer: Medicare Other | Admitting: Internal Medicine

## 2015-06-17 ENCOUNTER — Encounter: Payer: Medicare Other | Attending: Internal Medicine | Admitting: Internal Medicine

## 2015-06-17 DIAGNOSIS — I509 Heart failure, unspecified: Secondary | ICD-10-CM | POA: Diagnosis not present

## 2015-06-17 DIAGNOSIS — L97212 Non-pressure chronic ulcer of right calf with fat layer exposed: Secondary | ICD-10-CM | POA: Insufficient documentation

## 2015-06-17 DIAGNOSIS — I87331 Chronic venous hypertension (idiopathic) with ulcer and inflammation of right lower extremity: Secondary | ICD-10-CM | POA: Diagnosis not present

## 2015-06-17 DIAGNOSIS — N183 Chronic kidney disease, stage 3 (moderate): Secondary | ICD-10-CM | POA: Insufficient documentation

## 2015-06-17 DIAGNOSIS — I130 Hypertensive heart and chronic kidney disease with heart failure and stage 1 through stage 4 chronic kidney disease, or unspecified chronic kidney disease: Secondary | ICD-10-CM | POA: Insufficient documentation

## 2015-06-17 DIAGNOSIS — Z992 Dependence on renal dialysis: Secondary | ICD-10-CM | POA: Insufficient documentation

## 2015-06-18 NOTE — Progress Notes (Signed)
[name] [name], [name] [name] ([id number]) Visit Report for 06/17/2015 Abuse/Suicide Risk Screen Details Patient Name: [name] [name], [name] [name] Date of Service: 06/17/2015 12:45 PM Medical Record Patient Account Number: [id number] Number: Treating RN: Curtis Sites [birth year] (82 y.o. Other Clinician: Date of Birth/Sex: Female) Treating ROBSON, MICHAEL Primary Care Physician/Extender: Otilio Carpen Physician: Referring Physician: Alferd Patee Weeks in Treatment: 0 Abuse/Suicide Risk Screen Items Answer ABUSE/SUICIDE RISK SCREEN: Has anyone close to you tried to hurt or harm you recentlyo No Do you feel uncomfortable with anyone in your familyo No Has anyone forced you do things that you didnot want to doo No Do you have any thoughts of harming yourselfo No Patient displays signs or symptoms of abuse and/or neglect. No Electronic Signature(s) Signed: 06/17/2015 5:20:55 PM By: Curtis Sites Entered By: Curtis Sites on 06/17/2015 13:11:53 [name] [name], [name] [name] ([id number]) -------------------------------------------------------------------------------- Activities of Daily Living Details Patient Name: [name] [name] Date of Service: 06/17/2015 12:45 PM Medical Record Patient Account Number: [id number] Number: Treating RN: Curtis Sites 1919-07-12 (82 y.o. Other Clinician: Date of Birth/Sex: Female) Treating ROBSON, MICHAEL Primary Care Physician/Extender: Otilio Carpen Physician: Referring Physician: Alferd Patee Weeks in Treatment: 0 Activities of Daily Living Items Answer Activities of Daily Living (Please select one for each item) Drive Automobile Not Able Take Medications Completely Able Use Telephone Completely Able Care for Appearance Completely Able Use Toilet Completely Able Bath / Shower Need Assistance Dress Self Completely Able Feed Self Completely Able Walk Completely Able Get In / Out Bed Completely Able Housework Need Assistance Prepare Meals Need  Assistance Handle Money Completely Able Shop for Self Need Assistance Electronic Signature(s) Signed: 06/17/2015 5:20:55 PM By: Curtis Sites Entered By: Curtis Sites on 06/17/2015 13:12:28 [name] [name] ([id number]) -------------------------------------------------------------------------------- Education Assessment Details Patient Name: [name] [name] Date of Service: 06/17/2015 12:45 PM Medical Record Patient Account Number: [id number] Number: Treating RN: Curtis Sites [birth year] (82 y.o. Other Clinician: Date of Birth/Sex: Female) Treating ROBSON, MICHAEL Primary Care Physician/Extender: Otilio Carpen Physician: Referring Physician: Dorothy Puffer in Treatment: 0 Primary Learner Assessed: Patient Learning Preferences/Education Level/Primary Language Learning Preference: Explanation, Demonstration Highest Education Level: High School Preferred Language: English Cognitive Barrier Assessment/Beliefs Language Barrier: No Translator Needed: No Memory Deficit: No Emotional Barrier: No Cultural/Religious Beliefs Affecting Medical No Care: Physical Barrier Assessment Impaired Vision: No Impaired Hearing: No Decreased Hand dexterity: No Knowledge/Comprehension Assessment Knowledge Level: Medium Comprehension Level: Medium Ability to understand written Medium instructions: Ability to understand verbal Medium instructions: Motivation Assessment Anxiety Level: Anxious Cooperation: Cooperative Education Importance: Acknowledges Need Interest in Health Problems: Asks Questions Perception: Coherent Willingness to Engage in Self- Medium Management Activities: [name] [name], [name] [name] ([id number]) Readiness to Engage in Self- Management Activities: Electronic Signature(s) Signed: 06/17/2015 5:20:55 PM By: Curtis Sites Entered By: Curtis Sites on 06/17/2015 13:12:55 [name] [name], [name] [name]  ([id number]) -------------------------------------------------------------------------------- Fall Risk Assessment Details Patient Name: [name] [name] Date of Service: 06/17/2015 12:45 PM Medical Record Patient Account Number: [id number] Number: Treating RN: Curtis Sites 02-18-1920 (82 y.o. Other Clinician: Date of Birth/Sex: Female) Treating ROBSON, MICHAEL Primary Care Physician/Extender: Yetta Numbers, Thurston Hole Physician: Referring Physician: Dorothy Puffer in Treatment: 0 Fall Risk Assessment Items Have you had 2 or more falls in the last 12 monthso 0 No Have you had any fall that resulted in injury in the last 12 monthso 0 No FALL RISK ASSESSMENT: History of falling - immediate or within 3 months 0 No Secondary diagnosis 0 No Ambulatory aid None/bed rest/wheelchair/nurse 0 No Crutches/cane/walker 15 Yes Furniture 0 No IV Access/Saline Lock 0  No Gait/Training Normal/bed rest/immobile 0 No Weak 10 Yes Impaired 0 No Mental Status Oriented to own ability 0 Yes Electronic Signature(s) Signed: 06/17/2015 5:20:55 PM By: Curtis Sites Entered By: Curtis Sites on 06/17/2015 13:13:07 [name] [name], [name] [name] ([id number]) -------------------------------------------------------------------------------- Foot Assessment Details Patient Name: [name] [name] Date of Service: 06/17/2015 12:45 PM Medical Record Patient Account Number: [id number] Number: Treating RN: Curtis Sites [birth year] (82 y.o. Other Clinician: Date of Birth/Sex: Female) Treating ROBSON, MICHAEL Primary Care Physician/Extender: Yetta Numbers, Thurston Hole Physician: Referring Physician: Alferd Patee Weeks in Treatment: 0 Foot Assessment Items Site Locations + = Sensation present, - = Sensation absent, C = Callus, U = Ulcer R = Redness, W = Warmth, M = Maceration, PU = Pre-ulcerative lesion F = Fissure, S = Swelling, D = Dryness Assessment Right: Left: Other Deformity: No No Prior Foot Ulcer: No  No Prior Amputation: No No Charcot Joint: No No Ambulatory Status: Ambulatory With Help Assistance Device: Walker Gait: Surveyor, mining) Signed: 06/17/2015 5:20:55 PM By: [name] [name], [name] [name] ([id number]) Entered By: Curtis Sites on 06/17/2015 13:13:32 [name] [name], [name] [name] ([id number]) -------------------------------------------------------------------------------- Nutrition Risk Assessment Details Patient Name: [name] [name] Date of Service: 06/17/2015 12:45 PM Medical Record Patient Account Number: [id number] Number: Treating RN: Curtis Sites 04-06-1920 (82 y.o. Other Clinician: Date of Birth/Sex: Female) Treating ROBSON, MICHAEL Primary Care Physician/Extender: Yetta Numbers, Thurston Hole Physician: Referring Physician: Alferd Patee Weeks in Treatment: 0 Height (in): 64 Weight (lbs): 171 Body Mass Index (BMI): 29.3 Nutrition Risk Assessment Items NUTRITION RISK SCREEN: I have an illness or condition that made me change the kind and/or 0 No amount of food I eat I eat fewer than two meals per day 0 No I eat few fruits and vegetables, or milk products 0 No I have three or more drinks of beer, liquor or wine almost every day 0 No I have tooth or mouth problems that make it hard for me to eat 0 No I don't always have enough money to buy the food I need 0 No I eat alone most of the time 0 No I take three or more different prescribed or over-the-counter drugs a 1 Yes day Without wanting to, I have lost or gained 10 pounds in the last six 0 No months I am not always physically able to shop, cook and/or feed myself 0 No Nutrition Protocols Good Risk Protocol 0 No interventions needed Moderate Risk Protocol Electronic Signature(s) Signed: 06/17/2015 5:20:55 PM By: Curtis Sites Entered By: Curtis Sites on 06/17/2015 13:13:13

## 2015-06-18 NOTE — Progress Notes (Signed)
SEHAJ, KOLDEN ([id number]) Visit Report for 06/17/2015 Allergy List Details Patient Name: [name] [name], ROSCH Date of Service: 06/17/2015 12:45 PM Medical Record Patient Account Number: [id number] Number: Treating RN: Curtis Sites Sep 27, 1919 (82 y.o. Other Clinician: Date of Birth/Sex: Female) Treating ROBSON, MICHAEL Primary Care Physician/Extender: Otilio Carpen Physician: Referring Physician: Alferd Patee Weeks in Treatment: 0 Allergies Active Allergies codeine Allergy Notes Electronic Signature(s) Signed: 06/17/2015 5:20:55 PM By: Curtis Sites Entered By: Curtis Sites on 06/17/2015 13:23:16 Kaneko, Marco ([id number]) -------------------------------------------------------------------------------- Arrival Information Details Patient Name: [name] [name] Date of Service: 06/17/2015 12:45 PM Medical Record Patient Account Number: [id number] Number: Treating RN: Curtis Sites 03-21-1920 (82 y.o. Other Clinician: Date of Birth/Sex: Female) Treating ROBSON, MICHAEL Primary Care Physician/Extender: Otilio Carpen Physician: Referring Physician: Dorothy Puffer in Treatment: 0 Visit Information Patient Arrived: Walker Arrival Time: 12:58 Accompanied By: self Transfer Assistance: None Patient Identification Verified: Yes Secondary Verification Yes Process Completed: Patient Has Alerts: Yes Patient Alerts: bilat leg noncompressible Electronic Signature(s) Signed: 06/17/2015 5:20:55 PM By: Curtis Sites Entered By: Curtis Sites on 06/17/2015 13:22:50 Montilla, Cecylia ([id number]) -------------------------------------------------------------------------------- Encounter Discharge Information Details Patient Name: [name] [name] Date of Service: 06/17/2015 12:45 PM Medical Record Patient Account Number: [id number] Number: Treating RN: Curtis Sites 01/14/1920 (82 y.o. Other Clinician: Date of Birth/Sex: Female) Treating ROBSON,  MICHAEL Primary Care Physician/Extender: Otilio Carpen Physician: Referring Physician: Dorothy Puffer in Treatment: 0 Encounter Discharge Information Items Discharge Pain Level: 0 Discharge Condition: Stable Ambulatory Status: Walker Other (Note Discharge Destination: Required) Transportation: Private Auto Accompanied By: self Schedule Follow-up Appointment: Yes Medication Reconciliation completed No and provided to Patient/Care Nikeisha Klutz: Provided on Clinical Summary of Care: 06/17/2015 Form Type Recipient Paper Patient HM Notes patient left our office and went straight to [facility] for DVT study Electronic Signature(s) Signed: 06/17/2015 4:45:31 PM By: Curtis Sites Previous Signature: 06/17/2015 1:59:32 PM Version By: Gwenlyn Perking Entered By: Curtis Sites on 06/17/2015 16:45:31 Corlew, Rhiann ([id number]) -------------------------------------------------------------------------------- Lower Extremity Assessment Details Patient Name: [name] [name] Date of Service: 06/17/2015 12:45 PM Medical Record Patient Account Number: [id number] Number: Treating RN: Curtis Sites [birth year] (82 y.o. Other Clinician: Date of Birth/Sex: Female) Treating ROBSON, MICHAEL Primary Care Physician/Extender: Yetta Numbers, Thurston Hole Physician: Referring Physician: Alferd Patee Weeks in Treatment: 0 Edema Assessment Assessed: [Left: No] [Right: No] E[Left: dema] [Right: :] Calf Left: Right: Point of Measurement: 35 cm From Medial Instep 34.7 cm 38.3 cm Ankle Left: Right: Point of Measurement: 11 cm From Medial Instep 22.8 cm 22.6 cm Vascular Assessment Pulses: Posterior Tibial Dorsalis Pedis Palpable: [Left:Yes] [Right:Yes] Doppler: [Left:Multiphasic] [Right:Monophasic] Extremity colors, hair growth, and conditions: Extremity Color: [Left:Normal] [Right:Red] Hair Growth on Extremity: [Left:No] [Right:No] Capillary Refill: [Left:> 3 seconds] [Right:> 3 seconds] Toe Nail  Assessment Left: Right: Thick: Yes Yes Discolored: Yes Yes Deformed: No No Improper Length and Hygiene: No No Notes right and left legs non compressible BUTRICK, Racheal ([id number]) Electronic Signature(s) Signed: 06/17/2015 5:20:55 PM By: Curtis Sites Entered By: Curtis Sites on 06/17/2015 13:23:07 [name] [name] ([id number]) -------------------------------------------------------------------------------- Multi Wound Chart Details Patient Name: [name] [name] Date of Service: 06/17/2015 12:45 PM Medical Record Patient Account Number: [id number] Number: Treating RN: Curtis Sites 05-26-1919 (82 y.o. Other Clinician: Date of Birth/Sex: Female) Treating ROBSON, MICHAEL Primary Care Physician/Extender: Yetta Numbers, Thurston Hole Physician: Referring Physician: Alferd Patee Weeks in Treatment: 0 Vital Signs Height(in): 64 Pulse(bpm): 67 Weight(lbs): 171 Blood Pressure 150/69 (mmHg): Body Mass Index(BMI): 29 Temperature(F): 98.2 Respiratory Rate 18 (breaths/min): Photos: [1:No Photos] [N/A:N/A] Wound Location: [1:Right  Lower Leg - Medial] [N/A:N/A] Wounding Event: [1:Trauma] [N/A:N/A] Primary Etiology: [1:Venous Leg Ulcer] [N/A:N/A] Comorbid History: [1:Congestive Heart Failure, Hypertension] [N/A:N/A] Date Acquired: [1:05/11/2015] [N/A:N/A] Weeks of Treatment: [1:0] [N/A:N/A] Wound Status: [1:Open] [N/A:N/A] Measurements L x W x D 5.2x2.8x0.3 [N/A:N/A] (cm) Area (cm) : [1:11.435] [N/A:N/A] Volume (cm) : [1:3.431] [N/A:N/A] % Reduction in Area: [1:0.00%] [N/A:N/A] % Reduction in Volume: 0.00% [N/A:N/A] Classification: [1:Full Thickness Without Exposed Support Structures] [N/A:N/A] Exudate Amount: [1:Large] [N/A:N/A] Exudate Type: [1:Purulent] [N/A:N/A] Exudate Color: [1:yellow, brown, green] [N/A:N/A] Wound Margin: [1:Flat and Intact] [N/A:N/A] Granulation Amount: [1:Medium (34-66%)] [N/A:N/A] Granulation Quality: [1:Red] [N/A:N/A] Necrotic Amount: [1:Medium  (34-66%)] [N/A:N/A] Necrotic Tissue: [1:Eschar, Adherent Slough] [N/A:N/A] Exposed Structures: [N/A:N/A] Fascia: No Fat: No Tendon: No Muscle: No Joint: No Bone: No Limited to Skin Breakdown Epithelialization: None N/A N/A Periwound Skin Texture: Edema: Yes N/A N/A Excoriation: No Induration: No Callus: No Crepitus: No Fluctuance: No Friable: No Rash: No Scarring: No Periwound Skin Maceration: Yes N/A N/A Moisture: Moist: Yes Dry/Scaly: No Periwound Skin Color: Erythema: Yes N/A N/A Atrophie Blanche: No Cyanosis: No Ecchymosis: No Hemosiderin Staining: No Mottled: No Pallor: No Rubor: No Erythema Location: Circumferential N/A N/A Temperature: Hot N/A N/A Tenderness on Yes N/A N/A Palpation: Wound Preparation: Ulcer Cleansing: N/A N/A Rinsed/Irrigated with Saline Topical Anesthetic Applied: Other: lidocaine 4% Treatment Notes Electronic Signature(s) Signed: 06/17/2015 5:20:55 PM By: Curtis Sites Entered By: Curtis Sites on 06/17/2015 13:23:36 Yoshimura, Anjelita ([id number]) -------------------------------------------------------------------------------- Multi-Disciplinary Care Plan Details Patient Name: [name] [name] Date of Service: 06/17/2015 12:45 PM Medical Record Patient Account Number: [id number] Number: Treating RN: Curtis Sites July 20, 1919 (82 y.o. Other Clinician: Date of Birth/Sex: Female) Treating ROBSON, MICHAEL Primary Care Physician/Extender: Yetta Numbers, Thurston Hole Physician: Referring Physician: Dorothy Puffer in Treatment: 0 Active Inactive Abuse / Safety / Falls / Self Care Management Nursing Diagnoses: Impaired physical mobility Potential for falls Goals: Patient will remain injury free Date Initiated: 06/17/2015 Goal Status: Active Interventions: Assess fall risk on admission and as needed Notes: Orientation to the Wound Care Program Nursing Diagnoses: Knowledge deficit related to the wound healing center  program Goals: Patient/caregiver will verbalize understanding of the Wound Healing Center Program Date Initiated: 06/17/2015 Goal Status: Active Interventions: Provide education on orientation to the wound center Notes: Wound/Skin Impairment Nursing Diagnoses: Impaired tissue integrity Amer, Janelly ([id number]) Goals: Patient/caregiver will verbalize understanding of skin care regimen Date Initiated: 06/17/2015 Goal Status: Active Ulcer/skin breakdown will have a volume reduction of 30% by week 4 Date Initiated: 06/17/2015 Goal Status: Active Ulcer/skin breakdown will have a volume reduction of 50% by week 8 Date Initiated: 06/17/2015 Goal Status: Active Ulcer/skin breakdown will have a volume reduction of 80% by week 12 Date Initiated: 06/17/2015 Goal Status: Active Ulcer/skin breakdown will heal within 14 weeks Date Initiated: 06/17/2015 Goal Status: Active Interventions: Assess ulceration(s) every visit Notes: Electronic Signature(s) Signed: 06/17/2015 5:20:55 PM By: Curtis Sites Entered By: Curtis Sites on 06/17/2015 13:33:51 Killough, Vanesha ([id number]) -------------------------------------------------------------------------------- Pain Assessment Details Patient Name: [name] [name] Date of Service: 06/17/2015 12:45 PM Medical Record Patient Account Number: [id number] Number: Treating RN: Curtis Sites May 15, 1919 (82 y.o. Other Clinician: Date of Birth/Sex: Female) Treating ROBSON, MICHAEL Primary Care Physician/Extender: Otilio Carpen Physician: Referring Physician: Alferd Patee Weeks in Treatment: 0 Active Problems Location of Pain Severity and Description of Pain Patient Has Paino Yes Site Locations Pain Location: Pain in Ulcers With Dressing Change: Yes Duration of the Pain. Constant / Intermittento Constant Pain Management and Medication Current Pain Management: Electronic Signature(s) Signed: 06/17/2015  5:20:55 PM By: Curtis Sites Entered  By: Curtis Sites on 06/17/2015 13:06:15 Markov, Earley Abide ([id number]) -------------------------------------------------------------------------------- Patient/Caregiver Education Details Patient Name: [name] [name] Date of Service: 06/17/2015 12:45 PM Medical Record Patient Account Number: [id number] Number: Treating RN: Curtis Sites July 27, 1919 (82 y.o. Other Clinician: Date of Birth/Gender: Female) Treating ROBSON, MICHAEL Primary Care Physician/Extender: Otilio Carpen Physician: Tania Ade in Treatment: 0 Referring Physician: Alferd Patee Education Assessment Education Provided To: Patient Education Topics Provided Wound/Skin Impairment: Handouts: Other: wound care as ordered Methods: Demonstration, Explain/Verbal Responses: State content correctly Electronic Signature(s) Signed: 06/17/2015 4:45:47 PM By: Curtis Sites Entered By: Curtis Sites on 06/17/2015 16:45:47 Salas, Veronica ([id number]) -------------------------------------------------------------------------------- Wound Assessment Details Patient Name: [name] [name] Date of Service: 06/17/2015 12:45 PM Medical Record Patient Account Number: [id number] Number: Treating RN: Curtis Sites 1919/07/10 (82 y.o. Other Clinician: Date of Birth/Sex: Female) Treating ROBSON, MICHAEL Primary Care Physician/Extender: Otilio Carpen Physician: Referring Physician: Alferd Patee Weeks in Treatment: 0 Wound Status Wound Number: 1 Primary Venous Leg Ulcer Etiology: Wound Location: Right Lower Leg - Medial Wound Status: Open Wounding Event: Trauma Comorbid Congestive Heart Failure, Date Acquired: 05/11/2015 History: Hypertension Weeks Of Treatment: 0 Clustered Wound: No Photos Photo Uploaded By: Curtis Sites on 06/17/2015 16:48:58 Wound Measurements Length: (cm) 5.2 % Reduction in Width: (cm) 2.8 % Reduction in Depth: (cm) 0.3 Epithelializati Area: (cm) 11.435 Tunneling: Volume: (cm)  3.431 Undermining: Area: 0% Volume: 0% on: None No No Wound Description Full Thickness Without Exposed Foul Odor After Classification: Support Structures Wound Margin: Flat and Intact Exudate Large Amount: Exudate Type: Purulent Exudate Color: yellow, brown, green Cleansing: No Wound Bed Shasteen, Fynley ([id number]) Granulation Amount: Medium (34-66%) Exposed Structure Granulation Quality: Red Fascia Exposed: No Necrotic Amount: Medium (34-66%) Fat Layer Exposed: No Necrotic Quality: Eschar, Adherent Slough Tendon Exposed: No Muscle Exposed: No Joint Exposed: No Bone Exposed: No Limited to Skin Breakdown Periwound Skin Texture Texture Color No Abnormalities Noted: No No Abnormalities Noted: No Callus: No Atrophie Blanche: No Crepitus: No Cyanosis: No Excoriation: No Ecchymosis: No Fluctuance: No Erythema: Yes Friable: No Erythema Location: Circumferential Induration: No Hemosiderin Staining: No Localized Edema: Yes Mottled: No Rash: No Pallor: No Scarring: No Rubor: No Moisture Temperature / Pain No Abnormalities Noted: No Temperature: Hot Dry / Scaly: No Tenderness on Palpation: Yes Maceration: Yes Moist: Yes Wound Preparation Ulcer Cleansing: Rinsed/Irrigated with Saline Topical Anesthetic Applied: Other: lidocaine 4%, Treatment Notes Wound #1 (Right, Medial Lower Leg) 1. Cleansed with: Clean wound with Normal Saline 2. Anesthetic Topical Lidocaine 4% cream to wound bed prior to debridement 3. Peri-wound Care: Skin Prep 4. Dressing Applied: Prisma Ag 5. Secondary Dressing Applied Bordered Foam Dressing Dry Gauze Electronic Signature(s) Signed: 06/17/2015 5:20:55 PM By: Gwenyth Bender, Jiyah ([id number]) Entered By: Curtis Sites on 06/17/2015 13:18:07 Kochanowski, Santiana ([id number]) -------------------------------------------------------------------------------- Vitals Details Patient Name: [name] [name] Date of Service: 06/17/2015  12:45 PM Medical Record Patient Account Number: [id number] Number: Treating RN: Curtis Sites 1919-08-11 (82 y.o. Other Clinician: Date of Birth/Sex: Female) Treating ROBSON, MICHAEL Primary Care Physician/Extender: Otilio Carpen Physician: Referring Physician: Alferd Patee Weeks in Treatment: 0 Vital Signs Time Taken: 13:04 Temperature (F): 98.2 Height (in): 64 Pulse (bpm): 67 Source: Stated Respiratory Rate (breaths/min): 18 Weight (lbs): 171 Blood Pressure (mmHg): 150/69 Source: Stated Reference Range: 80 - 120 mg / dl Body Mass Index (BMI): 29.3 Electronic Signature(s) Signed: 06/17/2015 5:20:55 PM By: Curtis Sites Entered By: Curtis Sites on 06/17/2015 13:06:02

## 2015-06-18 NOTE — Progress Notes (Signed)
TAISHA, PENNEBAKER ([id number]) Visit Report for 06/17/2015 Chief Complaint Document Details Patient Name: [name] [name], [name] [name] Date of Service: 06/17/2015 12:45 PM Medical Record Patient Account Number: [id number] Number: Treating RN: Curtis Sites Apr 25, 1920 (82 y.o. Other Clinician: Date of Birth/Sex: Female) Treating ROBSON, MICHAEL Primary Care Physician/Extender: Otilio Carpen Physician: Referring Physician: Dorothy Puffer in Treatment: 0 Information Obtained from: Patient Chief Complaint 82 year old woman here for review of a wound on her right medial calf just above the ankle Electronic Signature(s) Signed: 06/17/2015 5:51:40 PM By: Baltazar Najjar MD Entered By: Baltazar Najjar on 06/17/2015 13:57:09 Yasuda, Tema ([id number]) -------------------------------------------------------------------------------- Debridement Details Patient Name: [name] [name] Date of Service: 06/17/2015 12:45 PM Medical Record Patient Account Number: [id number] Number: Treating RN: Curtis Sites Apr 27, 1920 (82 y.o. Other Clinician: Date of Birth/Sex: Female) Treating ROBSON, MICHAEL Primary Care Physician/Extender: Yetta Numbers, ANNE Physician: Referring Physician: Dorothy Puffer in Treatment: 0 Debridement Performed for Wound #1 Right,Medial Lower Leg Assessment: Performed By: Physician Maxwell Caul, MD Debridement: Debridement Pre-procedure Yes Verification/Time Out Taken: Start Time: 13:34 Pain Control: Lidocaine 4% Topical Solution Level: Skin/Subcutaneous Tissue Total Area Debrided (L x 5.2 (cm) x 2.8 (cm) = 14.56 (cm) W): Tissue and other Viable, Non-Viable, Eschar, Fibrin/Slough, Subcutaneous material debrided: Instrument: Curette Bleeding: Minimum Hemostasis Achieved: Pressure End Time: 13:38 Procedural Pain: 0 Post Procedural Pain: 0 Response to Treatment: Procedure was tolerated well Post Debridement Measurements of Total Wound Length: (cm)  5.2 Width: (cm) 2.8 Depth: (cm) 0.6 Volume: (cm) 6.861 Post Procedure Diagnosis Same as Pre-procedure Electronic Signature(s) Signed: 06/17/2015 5:20:55 PM By: Curtis Sites Signed: 06/17/2015 5:51:40 PM By: Baltazar Najjar MD Entered By: Baltazar Najjar on 06/17/2015 13:56:46 Mandile, Kathelene ([id number]) Mercer, [place] ([id number]) -------------------------------------------------------------------------------- HPI Details Patient Name: [name] [name] Date of Service: 06/17/2015 12:45 PM Medical Record Patient Account Number: [id number] Number: Treating RN: Curtis Sites [birth year] (82 y.o. Other Clinician: Date of Birth/Sex: Female) Treating ROBSON, MICHAEL Primary Care Physician/Extender: Otilio Carpen Physician: Referring Physician: Alferd Patee Weeks in Treatment: 0 History of Present Illness HPI Description: VENOUS STUDIES: ARTERIAL STUDIES: ABIs in this clinic noncompressible CULTURES: Radiology; x-rays of the tib-fib on 05/31/15 showed no osteomyelitis no soft tissue gas CT SCAN MRI; ANTIBIOTICS PRESCRIBED; history; this is a patient who comes [place] an assisted living in [facility] [place]. The history behind this wound is really unclear. The patient does not relate a history of trauma, states that she simply noticed that there perhaps 5 or 6 weeks ago. She was seen in the ER at [facility] on 05/31/15. X-ray of the tib-fib did not show osteomyelitis. Her blood work was reasonably unremarkable creatinine of 1.25 suggesting stage III chronic renal failure. The patient arrived in our clinic today with a remanent Unna boot per the intake nurse. The patient has a very swollen erythematous right lower leg. She tells [place] that her "right leg is always larger than the left" she is not a diabetic. Does not have a history of lower extremity wounds. There is nothing else in [facility] other than a plain x-ray. Electronic Signature(s) Signed:  06/17/2015 5:51:40 PM By: Baltazar Najjar MD Entered By: Baltazar Najjar on 06/17/2015 14:06:08 [name] [name] ([id number]) -------------------------------------------------------------------------------- Physical Exam Details Patient Name: [name] [name] Date of Service: 06/17/2015 12:45 PM Medical Record Patient Account Number: [id number] Number: Treating RN: Curtis Sites 16-Aug-1919 (82 y.o. Other Clinician: Date of Birth/Sex: Female) Treating ROBSON, MICHAEL Primary Care Physician/Extender: Otilio Carpen Physician: Referring Physician: Alferd Patee Weeks in Treatment:  0 Constitutional Sitting or standing Blood Pressure is within target range for patient.. Pulse regular and within target range for patient.Marland Kitchen Respirations regular, non-labored and within target range.. Temperature is normal and within the target range for the patient.Marland Kitchen Respiratory Respiratory effort is easy and symmetric bilaterally. Rate is normal at rest and on room air.. Bilateral breath sounds are clear and equal in all lobes with no wheezes, rales or rhonchi.. Cardiovascular 2/6 systolic ejection murmur radiating into the left carotid. Dorsalis pedis pulse on the right was palpable. Edema, warmth and erythema in the right leg extending just above the knee posteriorly. Gastrointestinal (GI) Abdomen is soft and non-distended without masses or tenderness. Bowel sounds active in all quadrants.. No liver or spleen enlargement or tenderness.. Notes Wound exam; the area in question is on the lower right anterior medial leg just above the ankle. Fairly large wound with a nonviable fibrinous slough over the surface of. This was difficult to debride and there may may be some tendon exposure superiorly and this wound. There was no evidence of infection and nothing looked like it would be beneficial to culture. The swelling in her right leg with pitting edema did require a DVT rule out. Arterial Dopplers in this  clinic were noncompressible Electronic Signature(s) Signed: 06/17/2015 5:51:40 PM By: Baltazar Najjar MD Entered By: Baltazar Najjar on 06/17/2015 14:05:40 Mancinelli, Isabelle ([id number]) -------------------------------------------------------------------------------- Physician Orders Details Patient Name: [name] [name] Date of Service: 06/17/2015 12:45 PM Medical Record Patient Account Number: [id number] Number: Treating RN: Curtis Sites [birth year] (82 y.o. Other Clinician: Date of Birth/Sex: Female) Treating ROBSON, MICHAEL Primary Care Physician/Extender: Otilio Carpen Physician: Referring Physician: Dorothy Puffer in Treatment: 0 Verbal / Phone Orders: Yes Clinician: Dorthy, Joanna Read Back and Verified: Yes Diagnosis Coding Wound Cleansing Wound #1 Right,Medial Lower Leg o Clean wound with Normal Saline. Anesthetic Wound #1 Right,Medial Lower Leg o Topical Lidocaine 4% cream applied to wound bed prior to debridement Skin Barriers/Peri-Wound Care Wound #1 Right,Medial Lower Leg o Skin Prep Primary Wound Dressing Wound #1 Right,Medial Lower Leg o Prisma Ag - or equivalent Secondary Dressing Wound #1 Right,Medial Lower Leg o Dry Gauze o Boardered Foam Dressing Dressing Change Frequency Wound #1 Right,Medial Lower Leg o Change Dressing Monday, Wednesday, Friday Follow-up Appointments Wound #1 Right,Medial Lower Leg o Return Appointment in 1 week. Edema Control Wound #1 Right,Medial Lower Leg o Elevate legs to the level of the heart and pump ankles as often as possible Falkner, Rayona ([id number]) Home Health Wound #1 Right,Medial Lower Leg o Continue Home Health Visits o Home Health Nurse may visit PRN to address patientos wound care needs. o FACE TO FACE ENCOUNTER: MEDICARE and MEDICAID PATIENTS: I certify that this patient is under my care and that I had a face-to-face encounter that meets the physician face-to-face encounter  requirements with this patient on this date. The encounter with the patient was in whole or in part for the following MEDICAL CONDITION: (primary reason for Home Healthcare) MEDICAL NECESSITY: I certify, that based on my findings, NURSING services are a medically necessary home health service. HOME BOUND STATUS: I certify that my clinical findings support that this patient is homebound (i.e., Due to illness or injury, pt requires aid of supportive devices such as crutches, cane, wheelchairs, walkers, the use of special transportation or the assistance of another person to leave their place of residence. There is a normal inability to leave the home and doing so requires considerable and taxing effort. Other absences are for  medical reasons / religious services and are infrequent or of short duration when for other reasons). o If current dressing causes regression in wound condition, may D/C ordered dressing product/s and apply Normal Saline Moist Dressing daily until next Wound Healing Center / Other MD appointment. Notify Wound Healing Center of regression in wound condition at [phone]. o Please direct any NON-WOUND related issues/requests for orders to patient's Primary Care Physician Consults o Vascular - for DVT study Services and Therapies o Venous Studies -Bilateral o Arterial Studies- Bilateral Electronic Signature(s) Signed: 06/17/2015 5:20:55 PM By: Curtis Sites Signed: 06/17/2015 5:51:40 PM By: Baltazar Najjar MD Entered By: Curtis Sites on 06/17/2015 14:06:07 Butner, Tallula ([id number]) -------------------------------------------------------------------------------- Problem List Details Patient Name: [name] [name] Date of Service: 06/17/2015 12:45 PM Medical Record Patient Account Number: [id number] Number: Treating RN: Curtis Sites 1919-12-24 (82 y.o. 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/17/2015 Yes exposed I87.331 Chronic venous hypertension (idiopathic) with ulcer and 06/17/2015 Yes inflammation of right lower extremity Inactive Problems Resolved Problems Electronic Signature(s) Signed: 06/17/2015 5:51:40 PM By: Baltazar Najjar MD Entered By: Baltazar Najjar on 06/17/2015 13:56:18 Rosell, Lorea ([id number]) -------------------------------------------------------------------------------- Progress Note Details Patient Name: [name] [name] Date of Service: 06/17/2015 12:45 PM Medical Record Patient Account Number: [id number] Number: Treating RN: Curtis Sites 04/09/1920 (82 y.o. Other Clinician: Date of Birth/Sex: Female) Treating ROBSON, MICHAEL Primary Care Physician/Extender: Yetta Numbers, Thurston Hole Physician: Referring Physician: Dorothy Puffer in Treatment: 0 Subjective Chief Complaint Information obtained from Patient 82 year old woman here for review of a wound on her right medial calf just above the ankle History of Present Illness (HPI) VENOUS STUDIES: ARTERIAL STUDIES: ABIs in this clinic noncompressible CULTURES: Radiology; x-rays of the tib-fib on 05/31/15 showed no osteomyelitis no soft tissue gas CT SCAN MRI; ANTIBIOTICS PRESCRIBED; history; this is a patient who comes to [place] from an assisted living in [facility] [place]. The history behind this wound is really unclear. The patient does not relate a history of trauma, states that she simply noticed that there perhaps 5 or 6 weeks ago. She was seen in the ER at [facility] on 05/31/15. X-ray of the tib-fib did not show osteomyelitis. Her blood work was reasonably unremarkable creatinine of 1.25 suggesting stage III chronic renal failure.  The patient arrived in our clinic today with a remanent Unna boot per the intake nurse. The patient has a very swollen erythematous right lower leg. She tells [place] that her "right leg is always larger than the left" she is not a diabetic. Does not have a history of lower extremity wounds. There is nothing else in [facility] other than a plain x-ray. Wound History Patient presents with 1 open wound that has been present for approximately 5 weeks. Patient has been treating wound in the following manner: unna boot. Laboratory tests have not been performed in the last month. Patient reportedly has not tested positive for an antibiotic resistant organism. Patient reportedly has not tested positive for osteomyelitis. Patient reportedly has not had testing performed to evaluate circulation in the legs. Patient experiences the following problems associated with their wounds: swelling. Patient History Information obtained from Patient. Allergies codeine Knighton, Meridith ([id number]) Social History Never smoker, Marital Status - Widowed, Alcohol Use - Never, Drug Use - No History, Caffeine Use -  Never. Medical History Cardiovascular Patient has history of Congestive Heart Failure, Hypertension Oncologic Denies history of Received Chemotherapy, Received Radiation Review of Systems (ROS) Constitutional Symptoms (General Health) The patient has no complaints or symptoms. Eyes Complains or has symptoms of Glasses / Contacts - glasses. Ear/Nose/Mouth/Throat The patient has no complaints or symptoms. Hematologic/Lymphatic The patient has no complaints or symptoms. Respiratory The patient has no complaints or symptoms. Gastrointestinal The patient has no complaints or symptoms. Endocrine The patient has no complaints or symptoms. Genitourinary Complains or has symptoms of Kidney failure/ Dialysis - CKD stage 3. Immunological The patient has no complaints or symptoms. Integumentary (Skin) The  patient has no complaints or symptoms. Musculoskeletal The patient has no complaints or symptoms. Neurologic The patient has no complaints or symptoms. Oncologic The patient has no complaints or symptoms. Psychiatric The patient has no complaints or symptoms. Objective Constitutional Shugart, Jelani ([id number]) Sitting or standing Blood Pressure is within target range for patient.. Pulse regular and within target range for patient.Marland Kitchen Respirations regular, non-labored and within target range.. Temperature is normal and within the target range for the patient.. Vitals Time Taken: 1:04 PM, Height: 64 in, Source: Stated, Weight: 171 lbs, Source: Stated, BMI: 29.3, Temperature: 98.2 F, Pulse: 67 bpm, Respiratory Rate: 18 breaths/min, Blood Pressure: 150/69 mmHg. Respiratory Respiratory effort is easy and symmetric bilaterally. Rate is normal at rest and on room air.. Bilateral breath sounds are clear and equal in all lobes with no wheezes, rales or rhonchi.. Cardiovascular 2/6 systolic ejection murmur radiating into the left carotid. Dorsalis pedis pulse on the right was palpable. Edema, warmth and erythema in the right leg extending just above the knee posteriorly. Gastrointestinal (GI) Abdomen is soft and non-distended without masses or tenderness. Bowel sounds active in all quadrants.. No liver or spleen enlargement or tenderness.. General Notes: Wound exam; the area in question is on the lower right anterior medial leg just above the ankle. Fairly large wound with a nonviable fibrinous slough over the surface of. This was difficult to debride and there may may be some tendon exposure superiorly and this wound. There was no evidence of infection and nothing looked like it would be beneficial to culture. The swelling in her right leg with pitting edema did require a DVT rule out. Arterial Dopplers in this clinic were noncompressible Integumentary (Hair, Skin) Wound #1 status is Open.  Original cause of wound was Trauma. The wound is located on the Right,Medial Lower Leg. The wound measures 5.2cm length x 2.8cm width x 0.3cm depth; 11.435cm^2 area and 3.431cm^3 volume. The wound is limited to skin breakdown. There is no tunneling or undermining noted. There is a large amount of purulent drainage noted. The wound margin is flat and intact. There is medium (34-66%) red granulation within the wound bed. There is a medium (34-66%) amount of necrotic tissue within the wound bed including Eschar and Adherent Slough. The periwound skin appearance exhibited: Localized Edema, Maceration, Moist, Erythema. The periwound skin appearance did not exhibit: Callus, Crepitus, Excoriation, Fluctuance, Friable, Induration, Rash, Scarring, Dry/Scaly, Atrophie Blanche, Cyanosis, Ecchymosis, Hemosiderin Staining, Mottled, Pallor, Rubor. The surrounding wound skin color is noted with erythema which is circumferential. Periwound temperature was noted as Hot. The periwound has tenderness on palpation. 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 There was a Skin/Subcutaneous Tissue Debridement ([id number]) debridement with total area of 14.56 sq cm performed by Maxwell Caul, MD. with the following instrument(s): Curette to remove Viable and Non-Viable tissue/material including Fibrin/Slough, Eschar, and Subcutaneous after achieving pain control using Lidocaine 4% Topical Solution. A time out was conducted prior to the start of the procedure. A Minimum amount of bleeding was controlled with Pressure. The procedure was tolerated well with a pain level of 0 throughout and a pain level of 0 following the procedure.  Post Debridement Measurements: 5.2cm length x 2.8cm width x 0.6cm depth; 6.861cm^3 volume. Post procedure Diagnosis Wound #1: Same as Pre-Procedure Plan Wound Cleansing: Wound #1 Right,Medial Lower Leg: Clean wound with Normal Saline. Anesthetic: Wound #1 Right,Medial Lower Leg: Topical Lidocaine 4% cream applied to wound bed prior to debridement Skin Barriers/Peri-Wound Care: Wound #1 Right,Medial Lower Leg: Skin Prep Primary Wound Dressing: Wound #1 Right,Medial Lower Leg: Prisma Ag - or equivalent Secondary Dressing: Wound #1 Right,Medial Lower Leg: Dry Gauze Boardered Foam Dressing Dressing Change Frequency: Wound #1 Right,Medial Lower Leg: Change Dressing Monday, Wednesday, Friday Follow-up Appointments: Wound #1 Right,Medial Lower Leg: Return Appointment in 1 week. Edema Control: Wound #1 Right,Medial Lower Leg: Fricker, Aalyssa ([id number]) Elevate legs to the level of the heart and pump ankles as often as possible Home Health: Wound #1 Right,Medial Lower Leg: Continue Home Health Visits Home Health Nurse may visit PRN to address patient s wound care needs. FACE TO FACE ENCOUNTER: MEDICARE and MEDICAID PATIENTS: I certify that this patient is under my care and that I had a face-to-face encounter that meets the physician face-to-face encounter requirements with this patient on this date. The encounter with the patient was in whole or in part for the following MEDICAL CONDITION: (primary reason for Home Healthcare) MEDICAL NECESSITY: I certify, that based on my findings, NURSING services are a medically necessary home health service. HOME BOUND STATUS: I certify that my clinical findings support that this patient is homebound (i.e., Due to illness or injury, pt requires aid of supportive devices such as crutches, cane, wheelchairs, walkers, the use of special transportation or the assistance of another person to leave their place of residence. There is a normal inability to  leave the home and doing so requires considerable and taxing effort. Other absences are for medical reasons / religious services and are infrequent or of short duration when for other reasons). If current dressing causes regression in wound condition, may D/C ordered dressing product/s and apply Normal Saline Moist Dressing daily until next Wound Healing Center / Other MD appointment. Notify Wound Healing Center of regression in wound condition at [phone]. Please direct any NON-WOUND related issues/requests for orders to patient's Primary Care Physician Services and Therapies ordered were: Venous Studies -Bilateral, Arterial Studies- Bilateral Consults ordered were: Vascular - for DVT study #1 the patient needs an acute DVT rule out and she'll be sent the vein and vascular #2 we applied silver collagen and a foam dressing to the wound pending the trip to vein and vascular. If this is negative she'll need a Profore light wrap on top of this dressing which can be changed every second or third day. #3 she'll probably ultimately need formal arterial Dopplers although this did not seem to be urgent today #4 likely to require a change to a sample based dressing, this does not look like a healthy wound Electronic Signature(s) Signed: 06/17/2015 5:51:40 PM By: Baltazar Najjar MD Entered By: Baltazar Najjar  on 06/17/2015 14:07:36 NAKEETA, SEBASTIANI ([id number]) -------------------------------------------------------------------------------- ROS/PFSH Details Patient Name: ELLIET, GOODNOW Date of Service: 06/17/2015 12:45 PM Medical Record Patient Account Number: [id number] Number: Treating RN: Curtis Sites [birth year] (82 y.o. Other Clinician: Date of Birth/Sex: Female) Treating ROBSON, MICHAEL Primary Care Physician/Extender: Yetta Numbers, Thurston Hole Physician: Referring Physician: Dorothy Puffer in Treatment: 0 Information Obtained From Patient Wound History Do you currently have one  or more open woundso Yes How many open wounds do you currently haveo 1 Approximately how long have you had your woundso 5 weeks How have you been treating your wound(s) until nowo unna boot Has your wound(s) ever healed and then re-openedo No Have you had any lab work done in the past montho No Have you tested positive for an antibiotic resistant organism (MRSA, VRE)o No Have you tested positive for osteomyelitis (bone infection)o No Have you had any tests for circulation on your legso No Have you had other problems associated with your woundso Swelling Eyes Complaints and Symptoms: Positive for: Glasses / Contacts - glasses Genitourinary Complaints and Symptoms: Positive for: Kidney failure/ Dialysis - CKD stage 3 Constitutional Symptoms (General Health) Complaints and Symptoms: No Complaints or Symptoms Ear/Nose/Mouth/Throat Complaints and Symptoms: No Complaints or Symptoms Hematologic/Lymphatic Complaints and Symptoms: No Complaints or Symptoms Prouty, Laterica ([id number]) Respiratory Complaints and Symptoms: No Complaints or Symptoms Cardiovascular Medical History: Positive for: Congestive Heart Failure; Hypertension Gastrointestinal Complaints and Symptoms: No Complaints or Symptoms Endocrine Complaints and Symptoms: No Complaints or Symptoms Immunological Complaints and Symptoms: No Complaints or Symptoms Integumentary (Skin) Complaints and Symptoms: No Complaints or Symptoms Musculoskeletal Complaints and Symptoms: No Complaints or Symptoms Neurologic Complaints and Symptoms: No Complaints or Symptoms Oncologic Complaints and Symptoms: No Complaints or Symptoms Medical History: Negative for: Received Chemotherapy; Received Radiation Psychiatric Complaints and Symptoms: No Complaints or Symptoms Haislip, Keely ([id number]) Immunizations Immunization Notes: up to date Family and Social History Never smoker; Marital Status - Widowed; Alcohol Use: Never;  Drug Use: No History; Caffeine Use: Never; Financial Concerns: No; Food, Clothing or Shelter Needs: No; Support System Lacking: No; Transportation Concerns: No; Advanced Directives: No; Patient does not want information on Advanced Directives Electronic Signature(s) Signed: 06/17/2015 5:20:55 PM By: Curtis Sites Signed: 06/17/2015 5:51:40 PM By: Baltazar Najjar MD Entered By: Curtis Sites on 06/17/2015 13:11:47 Camera, Alverda ([id number]) -------------------------------------------------------------------------------- SuperBill Details Patient Name: [name] [name] Date of Service: 06/17/2015 Medical Record Patient Account Number: [id number] Number: Treating RN: Curtis Sites Mar 01, 1920 (82 y.o. 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

## 2015-06-23 ENCOUNTER — Encounter: Payer: Medicare Other | Admitting: Internal Medicine

## 2015-06-23 DIAGNOSIS — I87331 Chronic venous hypertension (idiopathic) with ulcer and inflammation of right lower extremity: Secondary | ICD-10-CM | POA: Diagnosis not present

## 2015-06-25 NOTE — Progress Notes (Addendum)
RONELLA, PLUNK ([id number]) Visit Report for 06/23/2015 Arrival Information Details Patient Name: [name] [name], [name] [name] Date of Service: 06/23/2015 2:45 PM Medical Record Patient Account Number: [id number] Number: Treating RN: Clover Mealy, RN, BSN, Rita 01-25-1920 [phone] y.o. Other Clinician: Date of Birth/Sex: Female) Treating ROBSON, MICHAEL Primary Care Physician/Extender: Otilio Carpen Physician: Referring Physician: Dorothy Puffer in Treatment: 0 Visit Information History Since Last Visit Added or deleted any medications: No Patient Arrived: Walker Any new allergies or adverse reactions: No Arrival Time: 14:35 Had a fall or experienced change in No Accompanied By: self activities of daily living that may affect Transfer Assistance: None risk of falls: Patient Identification Verified: Yes Signs or symptoms of abuse/neglect since last No Secondary Verification Yes visito Process Completed: Hospitalized since last visit: No Patient Has Alerts: Yes Has Dressing in Place as Prescribed: Yes Patient Alerts: bilat leg Pain Present Now: No noncompressible Electronic Signature(s) Signed: 06/23/2015 4:45:24 PM By: Elpidio Eric BSN, RN Entered By: Elpidio Eric on 06/23/2015 14:36:05 [name] [name] ([id number]) -------------------------------------------------------------------------------- Encounter Discharge Information Details Patient Name: [name] [name] Date of Service: 06/23/2015 2:45 PM Medical Record Patient Account Number: [id number] Number: Treating RN: Clover Mealy, RN, BSN, Rita Apr 02, 1920 (82 y.o. Other Clinician: Date of Birth/Sex: Female) Treating ROBSON, MICHAEL Primary Care Physician/Extender: Otilio Carpen Physician: Referring Physician: Dorothy Puffer in Treatment: 0 Encounter Discharge Information Items Discharge Pain Level: 0 Discharge Condition: Stable Ambulatory Status: Walker Discharge Destination: Nursing Home Transportation:  Other Accompanied By: transporter/Richard Schedule Follow-up No Appointment: Medication Reconciliation completed and provided to No Patient/Care Maahir Horst: Provided on Clinical Summary of Care: 06/23/2015 Form Type Recipient Paper Patient HM Electronic Signature(s) Signed: 06/23/2015 3:17:19 PM By: Gwenlyn Perking Entered By: Gwenlyn Perking on 06/23/2015 15:17:19 Battle, Shandrika ([id number]) -------------------------------------------------------------------------------- Lower Extremity Assessment Details Patient Name: [name] [name] Date of Service: 06/23/2015 2:45 PM Medical Record Patient Account Number: [id number] Number: Treating RN: Clover Mealy, RN, BSN, Rita 1919-08-31 (82 y.o. Other Clinician: Date of Birth/Sex: Female) Treating ROBSON, MICHAEL Primary Care Physician/Extender: Yetta Numbers, Thurston Hole Physician: Referring Physician: Alferd Patee Weeks in Treatment: 0 Edema Assessment Assessed: [Left: No] [Right: No] E[Left: dema] [Right: :] Calf Left: Right: Point of Measurement: 35 cm From Medial Instep cm 38.2 cm Ankle Left: Right: Point of Measurement: 11 cm From Medial Instep cm 22.1 cm Vascular Assessment Claudication: Claudication Assessment [Right:None] Pulses: Posterior Tibial Dorsalis Pedis Palpable: [Right:No] Doppler: [Right:Monophasic] Extremity colors, hair growth, and conditions: Extremity Color: [Right:Mottled] Hair Growth on Extremity: [Right:No] Temperature of Extremity: [Right:Warm] Capillary Refill: [Right:< 3 seconds] Toe Nail Assessment Left: Right: Thick: Yes Discolored: Yes Deformed: No Improper Length and Hygiene: No PURA, PICINICH ([id number]) Electronic Signature(s) Signed: 06/23/2015 4:45:24 PM By: Elpidio Eric BSN, RN Entered By: Elpidio Eric on 06/23/2015 15:05:52 Teater, Bibi ([id number]) -------------------------------------------------------------------------------- Multi Wound Chart Details Patient Name: [name] [name] Date of  Service: 06/23/2015 2:45 PM Medical Record Patient Account Number: [id number] Number: Treating RN: Clover Mealy, RN, BSN, Rita 06-08-1919 (82 y.o. Other Clinician: Date of Birth/Sex: Female) Treating ROBSON, MICHAEL Primary Care Physician/Extender: Yetta Numbers, Thurston Hole Physician: Referring Physician: Alferd Patee Weeks in Treatment: 0 Vital Signs Height(in): 64 Pulse(bpm): 71 Weight(lbs): 171 Blood Pressure 150/52 (mmHg): Body Mass Index(BMI): 29 Temperature(F): 97.9 Respiratory Rate 19 (breaths/min): Photos: [1:No Photos] [2:No Photos] [N/A:N/A] Wound Location: [1:Right Lower Leg - Medial] [2:Right Lower Leg - Lateral] [N/A:N/A] Wounding Event: [1:Trauma] [2:Blister] [N/A:N/A] Primary Etiology: [1:Venous Leg Ulcer] [2:Venous Leg Ulcer] [N/A:N/A] Comorbid History: [1:Congestive Heart Failure, Hypertension] [2:Congestive Heart Failure, Hypertension] [N/A:N/A] Date Acquired: [1:05/11/2015] [2:06/21/2015] [N/A:N/A]  Weeks of Treatment: [1:0] [2:0] [N/A:N/A] Wound Status: [1:Open] [2:Open] [N/A:N/A] Measurements L x W x D 6.5x4.5x0.4 [2:1.9x1.5x0.1] [N/A:N/A] (cm) Area (cm) : [1:22.973] [2:2.238] [N/A:N/A] Volume (cm) : [1:9.189] [2:0.224] [N/A:N/A] % Reduction in Area: [1:-100.90%] [2:0.00%] [N/A:N/A] % Reduction in Volume: -167.80% [2:0.00%] [N/A:N/A] Classification: [1:Full Thickness Without Exposed Support Structures] [2:Partial Thickness] [N/A:N/A] Exudate Amount: [1:Large] [2:Medium] [N/A:N/A] Exudate Type: [1:Serosanguineous] [2:Serosanguineous] [N/A:N/A] Exudate Color: [1:red, brown] [2:red, brown] [N/A:N/A] Wound Margin: [1:Flat and Intact] [2:Distinct, outline attached] [N/A:N/A] Granulation Amount: [1:Medium (34-66%)] [2:Large (67-100%)] [N/A:N/A] Granulation Quality: [1:Red] [2:N/A] [N/A:N/A] Necrotic Amount: [1:Medium (34-66%)] [2:None Present (0%)] [N/A:N/A] Necrotic Tissue: [1:Eschar, Adherent Slough] [2:N/A] [N/A:N/A] Exposed Structures: [N/A:N/A] Fascia:  No Fascia: No Fat: No Fat: No Tendon: No Tendon: No Muscle: No Muscle: No Joint: No Joint: No Bone: No Bone: No Limited to Skin Limited to Skin Breakdown Breakdown Epithelialization: None None N/A Periwound Skin Texture: Edema: Yes Edema: Yes N/A Excoriation: No Excoriation: No Induration: No Induration: No Callus: No Callus: No Crepitus: No Crepitus: No Fluctuance: No Fluctuance: No Friable: No Friable: No Rash: No Rash: No Scarring: No Scarring: No Periwound Skin Maceration: Yes Maceration: Yes N/A Moisture: Moist: Yes Moist: Yes Dry/Scaly: No Dry/Scaly: No Periwound Skin Color: Erythema: Yes Atrophie Blanche: No N/A Atrophie Blanche: No Cyanosis: No Cyanosis: No Ecchymosis: No Ecchymosis: No Erythema: No Hemosiderin Staining: No Hemosiderin Staining: No Mottled: No Mottled: No Pallor: No Pallor: No Rubor: No Rubor: No Erythema Location: Circumferential N/A N/A Temperature: Hot Hot N/A Tenderness on Yes Yes N/A Palpation: Wound Preparation: Ulcer Cleansing: Ulcer Cleansing: N/A Rinsed/Irrigated with Rinsed/Irrigated with Saline Saline Topical Anesthetic Topical Anesthetic Applied: Other: lidocaine Applied: Other: lidocaine 4% 4% Treatment Notes Electronic Signature(s) Signed: 06/23/2015 4:45:24 PM By: Elpidio Eric BSN, RN Entered By: Elpidio Eric on 06/23/2015 14:46:10 [name] [name] ([id number]) -------------------------------------------------------------------------------- Multi-Disciplinary Care Plan Details Patient Name: [name] [name] Date of Service: 06/23/2015 2:45 PM Medical Record Patient Account Number: [id number] Number: Treating RN: Clover Mealy, RN, BSN, Rita 1920/03/23 [phone] y.o. Other Clinician: Date of Birth/Sex: Female) Treating ROBSON, MICHAEL Primary Care Physician/Extender: Otilio Carpen Physician: Referring Physician: Alferd Patee Weeks in Treatment: 0 Active Inactive Electronic Signature(s) Signed:  08/11/2015 9:44:53 AM By: Elpidio Eric BSN, RN Previous Signature: 06/23/2015 4:45:24 PM Version By: Elpidio Eric BSN, RN Entered By: Elpidio Eric on 08/11/2015 09:44:52 Embleton, Kenza ([id number]) -------------------------------------------------------------------------------- Pain Assessment Details Patient Name: [name] [name] Date of Service: 06/23/2015 2:45 PM Medical Record Patient Account Number: [id number] Number: Treating RN: Clover Mealy, RN, BSN, Rita [birth year] (82 y.o. Other Clinician: Date of Birth/Sex: Female) Treating ROBSON, MICHAEL Primary Care Physician/Extender: Otilio Carpen Physician: Referring Physician: Alferd Patee Weeks in Treatment: 0 Active Problems Location of Pain Severity and Description of Pain Patient Has Paino No Site Locations Pain Management and Medication Current Pain Management: Electronic Signature(s) Signed: 06/23/2015 4:45:24 PM By: Elpidio Eric BSN, RN Entered By: Elpidio Eric on 06/23/2015 14:36:14 Folkerts, Marlane ([id number]) -------------------------------------------------------------------------------- Patient/Caregiver Education Details Patient Name: [name] [name] Date of Service: 06/23/2015 2:45 PM Medical Record Patient Account Number: [id number] Number: Treating RN: Clover Mealy, RN, BSN, Rita 01-24-1920 (82 y.o. Other Clinician: Date of Birth/Gender: Female) Treating ROBSON, MICHAEL Primary Care Physician/Extender: Otilio Carpen Physician: Tania Ade in Treatment: 0 Referring Physician: Alferd Patee Education Assessment Education Provided To: Patient Education Topics Provided [facility]: Methods: Explain/Verbal Responses: State content correctly Wound Debridement: Methods: Explain/Verbal Responses: State content correctly Wound/Skin Impairment: Methods: Explain/Verbal Responses: State content correctly Electronic Signature(s) Signed: 06/23/2015 4:45:24 PM By: Elpidio Eric  BSN, RN Entered By:  Elpidio Eric on 06/23/2015 15:01:02 [name] [name] ([id number]) -------------------------------------------------------------------------------- Wound Assessment Details Patient Name: MARIELENA, HARVELL Date of Service: 06/23/2015 2:45 PM Medical Record Patient Account Number: [id number] Number: Treating RN: Clover Mealy, RN, BSN, Rita 1920/02/03 [phone] y.o. Other Clinician: Date of Birth/Sex: Female) Treating ROBSON, MICHAEL Primary Care Physician/Extender: Otilio Carpen Physician: Referring Physician: Alferd Patee Weeks in Treatment: 0 Wound Status Wound Number: 1 Primary Venous Leg Ulcer Etiology: Wound Location: Right Lower Leg - Medial Wound Status: Open Wounding Event: Trauma Comorbid Congestive Heart Failure, Date Acquired: 05/11/2015 History: Hypertension Weeks Of Treatment: 0 Clustered Wound: No Photos Photo Uploaded By: Elpidio Eric on 06/23/2015 16:39:07 Wound Measurements Length: (cm) 6.5 Width: (cm) 4.5 Depth: (cm) 0.4 Area: (cm) 22.973 Volume: (cm) 9.189 % Reduction in Area: -100.9% % Reduction in Volume: -167.8% Epithelialization: None Tunneling: No Undermining: No Wound Description Full Thickness Without Exposed Foul Odor Aft Classification: Support Structures [place], Deseri ([id number]) er Cleansing: No Wound Margin: Flat and Intact Exudate Large Amount: Exudate Type: Serosanguineous Exudate Color: red, brown Wound Bed Granulation Amount: Medium (34-66%) Exposed Structure Granulation Quality: Red Fascia Exposed: No Necrotic Amount: Medium (34-66%) Fat Layer Exposed: No Necrotic Quality: Eschar, Adherent Slough Tendon Exposed: No Muscle Exposed: No Joint Exposed: No Bone Exposed: No Limited to Skin Breakdown Periwound Skin Texture Texture Color No Abnormalities Noted: No No Abnormalities Noted: No Callus: No Atrophie Blanche: No Crepitus: No Cyanosis: No Excoriation: No Ecchymosis: No Fluctuance: No Erythema: Yes Friable:  No Erythema Location: Circumferential Induration: No Hemosiderin Staining: No Localized Edema: Yes Mottled: No Rash: No Pallor: No Scarring: No Rubor: No Moisture Temperature / Pain No Abnormalities Noted: No Temperature: Hot Dry / Scaly: No Tenderness on Palpation: Yes Maceration: Yes Moist: Yes Wound Preparation Ulcer Cleansing: Rinsed/Irrigated with Saline Topical Anesthetic Applied: Other: lidocaine 4%, Electronic Signature(s) Signed: 06/23/2015 4:45:24 PM By: Elpidio Eric BSN, RN Entered By: Elpidio Eric on 06/23/2015 14:45:02 Teare, Shawnta ([id number]) -------------------------------------------------------------------------------- Wound Assessment Details Patient Name: [name] [name] Date of Service: 06/23/2015 2:45 PM Medical Record Patient Account Number: [id number] Number: Treating RN: Clover Mealy, RN, BSN, Rita [birth year] [phone] y.o. Other Clinician: Date of Birth/Sex: Female) Treating ROBSON, MICHAEL Primary Care Physician/Extender: Otilio Carpen Physician: Referring Physician: Alferd Patee Weeks in Treatment: 0 Wound Status Wound Number: 2 Primary Venous Leg Ulcer Etiology: Wound Location: Right Lower Leg - Lateral Wound Status: Open Wounding Event: Blister Comorbid Congestive Heart Failure, Date Acquired: 06/21/2015 History: Hypertension Weeks Of Treatment: 0 Clustered Wound: No Photos Photo Uploaded By: Elpidio Eric on 06/23/2015 16:39:20 Wound Measurements Length: (cm) 1.9 Width: (cm) 1.5 Depth: (cm) 0.1 Area: (cm) 2.238 Volume: (cm) 0.224 % Reduction in Area: 0% % Reduction in Volume: 0% Epithelialization: None Tunneling: No Undermining: No Wound Description Classification: Partial Thickness Wound Margin: Distinct, outline attached Mcwright, Necole ([id number]) Foul Odor After Cleansing: No Exudate Amount: Medium Exudate Type: Serosanguineous Exudate Color: red, brown Wound Bed Granulation Amount: Large (67-100%) Exposed  Structure Necrotic Amount: None Present (0%) Fascia Exposed: No Fat Layer Exposed: No Tendon Exposed: No Muscle Exposed: No Joint Exposed: No Bone Exposed: No Limited to Skin Breakdown Periwound Skin Texture Texture Color No Abnormalities Noted: No No Abnormalities Noted: No Callus: No Atrophie Blanche: No Crepitus: No Cyanosis: No Excoriation: No Ecchymosis: No Fluctuance: No Erythema: No Friable: No Hemosiderin Staining: No Induration: No Mottled: No Localized Edema: Yes Pallor: No Rash: No Rubor: No Scarring: No Temperature / Pain Moisture Temperature: Hot No Abnormalities Noted: No Tenderness on Palpation: Yes  Dry / Scaly: No Maceration: Yes Moist: Yes Wound Preparation Ulcer Cleansing: Rinsed/Irrigated with Saline Topical Anesthetic Applied: Other: lidocaine 4%, Electronic Signature(s) Signed: 06/23/2015 4:45:24 PM By: Elpidio Eric BSN, RN Entered By: Elpidio Eric on 06/23/2015 14:45:53 Maciejewski, Shiann ([id number]) -------------------------------------------------------------------------------- Vitals Details Patient Name: [name] [name] Date of Service: 06/23/2015 2:45 PM Medical Record Patient Account Number: [id number] Number: Treating RN: Clover Mealy, RN, BSN, Rita 11-01-1919 (82 y.o. Other Clinician: Date of Birth/Sex: Female) Treating ROBSON, MICHAEL Primary Care Physician/Extender: Yetta Numbers, Thurston Hole Physician: Referring Physician: Alferd Patee Weeks in Treatment: 0 Vital Signs Time Taken: 14:39 Temperature (F): 97.9 Height (in): 64 Pulse (bpm): 71 Weight (lbs): 171 Respiratory Rate (breaths/min): 19 Body Mass Index (BMI): 29.3 Blood Pressure (mmHg): 150/52 Reference Range: 80 - 120 mg / dl Electronic Signature(s) Signed: 06/23/2015 4:45:24 PM By: Elpidio Eric BSN, RN Entered By: Elpidio Eric on 06/23/2015 14:39:29

## 2015-06-25 NOTE — Progress Notes (Signed)
[name], [name] ([id number]) Visit Report for 06/23/2015 Chief Complaint Document Details Patient Name: [name], [name] Date of Service: 06/23/2015 2:45 PM Medical Record Patient Account Number: [id number] Number: Treating RN: Clover Mealy, RN, BSN, Rita 01-26-1920 [phone] y.o. Other Clinician: Date of Birth/Sex: Female) Treating ROBSON, MICHAEL Primary Care Physician/Extender: Otilio Carpen Physician: Referring Physician: Dorothy Puffer in Treatment: 0 Information Obtained from: Patient Chief Complaint 82 year old woman here for review of a wound on her right medial calf just above the ankle Electronic Signature(s) Signed: 06/24/2015 5:04:49 PM By: Baltazar Najjar MD Entered By: Baltazar Najjar on 06/23/2015 15:19:32 [name], [name] ([id number]) -------------------------------------------------------------------------------- Debridement Details Patient Name: [name] [name] Date of Service: 06/23/2015 2:45 PM Medical Record Patient Account Number: [id number] Number: Treating RN: Clover Mealy, RN, BSN, Rita [birth year] (82 y.o. Other Clinician: Date of Birth/Sex: Female) Treating ROBSON, MICHAEL Primary Care Physician/Extender: Yetta Numbers, ANNE Physician: Referring Physician: Dorothy Puffer in Treatment: 0 Debridement Performed for Wound #1 Right,Medial Lower Leg Assessment: Performed By: Physician Maxwell Caul, MD Debridement: Debridement Pre-procedure Yes Verification/Time Out Taken: Start Time: 15:01 Pain Control: Lidocaine 4% Topical Solution Level: Skin/Subcutaneous Tissue Total Area Debrided (L x 6.5 (cm) x 4.5 (cm) = 29.25 (cm) W): Tissue and other Non-Viable, Exudate, Fibrin/Slough, Subcutaneous material debrided: Instrument: Curette Bleeding: Minimum Hemostasis Achieved: Pressure End Time: 15:05 Procedural Pain: 0 Post Procedural Pain: 0 Response to Treatment: Procedure was tolerated well Post Debridement Measurements of Total  Wound Length: (cm) 6.5 Width: (cm) 4.5 Depth: (cm) 0.4 Volume: (cm) 9.189 Post Procedure Diagnosis Same [name] Pre-procedure Electronic Signature(s) Signed: 06/23/2015 4:45:24 PM By: Elpidio Eric BSN, RN Signed: 06/24/2015 5:04:49 PM By: Baltazar Najjar MD Entered By: Baltazar Najjar on 06/23/2015 15:19:22 [name], [name] ([id number]) [name], [name] ([id number]) -------------------------------------------------------------------------------- HPI Details Patient Name: [name] [name] Date of Service: 06/23/2015 2:45 PM Medical Record Patient Account Number: [id number] Number: Treating RN: Clover Mealy, RN, BSN, Rita May 04, 1920 (82 y.o. Other Clinician: Date of Birth/Sex: Female) Treating ROBSON, MICHAEL Primary Care Physician/Extender: Otilio Carpen Physician: Referring Physician: Alferd Patee Weeks in Treatment: 0 History of Present Illness HPI Description: VENOUS STUDIES: ARTERIAL STUDIES: ABIs in this clinic noncompressible CULTURES: Radiology; x-rays of the tib-fib on 05/31/15 showed no osteomyelitis no soft tissue gas CT SCAN MRI; ANTIBIOTICS PRESCRIBED; history; this is a patient who comes to [place] an assisted living in [facility] [place]. The history behind this wound is really unclear. The patient does not relate a history of trauma, states that she simply noticed that there perhaps 5 or 6 weeks ago. She was seen in the ER at [facility] on 05/31/15. X-ray of the tib-fib did not show osteomyelitis. Her blood work was reasonably unremarkable creatinine of 1.25 suggesting stage III chronic renal failure. The patient arrived in our clinic today with a remanent Unna boot per the intake nurse. The patient has a very swollen erythematous right lower leg. She tells [place] that her "right leg is always larger than the left" she is not a diabetic. Does not have a history of lower extremity wounds. There is nothing else in [facility] other than a plain  x-ray. 06/23/15; this is a elderly woman that I saw last week for a fairly substantial and irregular wound on the medial aspect of her right leg just above the ankle. I was concerned last week that she could have an acute DVT and sent her over to vascular surgery. This was negative. I also wanted arterial Dopplers but I don't think these have been done.  She lives in an assisted living in [place] and I think a home health company is changing her dressing. She complains of a lot of pain in this area. Again she arrives with only a driver, the exact history of this wound other than what I was able to get last week is really unclear. Electronic Signature(s) Signed: 06/24/2015 5:04:49 PM By: Baltazar Najjar MD Entered By: Baltazar Najjar on 06/23/2015 15:21:17 [name] [name] ([id number]) -------------------------------------------------------------------------------- Physical Exam Details Patient Name: [name] [name] Date of Service: 06/23/2015 2:45 PM Medical Record Patient Account Number: [id number] Number: Treating RN: Clover Mealy, RN, BSN, Rita 08-22-1919 [phone] y.o. Other Clinician: Date of Birth/Sex: Female) Treating ROBSON, MICHAEL Primary Care Physician/Extender: Yetta Numbers, [place] Physician: Referring Physician: Alferd Patee Weeks in Treatment: 0 Cardiovascular I could not feel femoral pulses. Pedal pulses absent bilaterally.. Edema of the right leg greater than left. Notes Wound exam; the areas on the lower right medial leg just above the ankle. Again this does not have a very viable surface and even after surgical debridement there is not much in the way of bleeding or viable surface present. There is no clear evidence of surrounding infection. [name] noted above her peripheral pulses are palpable in no area in the right leg however there is no undue coldness of her foot and her capillary refill time appears normal Electronic Signature(s) Signed: 06/24/2015 5:04:49 PM By: Baltazar Najjar  MD Entered By: Baltazar Najjar on 06/23/2015 15:23:51 [name] [name] ([id number]) -------------------------------------------------------------------------------- Physician Orders Details Patient Name: [name] [name] Date of Service: 06/23/2015 2:45 PM Medical Record Patient Account Number: [id number] Number: Treating RN: Clover Mealy, RN, BSN, Rita 1919-11-04 (82 y.o. Other Clinician: Date of Birth/Sex: Female) Treating ROBSON, MICHAEL Primary Care Physician/Extender: Yetta Numbers, Thurston Hole Physician: Referring Physician: Dorothy Puffer in Treatment: 0 Verbal / Phone Orders: Yes Clinician: Afful, RN, BSN, Rita Read Back and Verified: Yes Diagnosis Coding Wound Cleansing Wound #1 Right,Medial Lower Leg o Clean wound with Normal Saline. Wound #2 Right,Lateral Lower Leg o Clean wound with Normal Saline. Anesthetic Wound #1 Right,Medial Lower Leg o Topical Lidocaine 4% cream applied to wound bed prior to debridement Wound #2 Right,Lateral Lower Leg o Topical Lidocaine 4% cream applied to wound bed prior to debridement Primary Wound Dressing Wound #1 Right,Medial Lower Leg o Aquacel Ag Wound #2 Right,Lateral Lower Leg o Aquacel Ag Secondary Dressing Wound #1 Right,Medial Lower Leg o Gauze and Kerlix/Conform Wound #2 Right,Lateral Lower Leg o Gauze and Kerlix/Conform Dressing Change Frequency Wound #1 Right,Medial Lower Leg o Change dressing every other day. Wound #2 Right,Lateral Lower Leg [name], [name] ([id number]) o Change dressing every other day. Follow-up Appointments Wound #1 Right,Medial Lower Leg o Return Appointment in 1 week. Wound #2 Right,Lateral Lower Leg o Return Appointment in 1 week. Home Health Wound #1 Right,Medial Lower Leg o Continue Home Health Visits o Home Health Nurse may visit PRN to address patientos wound care needs. o FACE TO FACE ENCOUNTER: MEDICARE and MEDICAID PATIENTS: I certify that this patient  is under my care and that I had a face-to-face encounter that meets the physician face-to-face encounter requirements with this patient on this date. The encounter with the patient was in whole or in part for the following MEDICAL CONDITION: (primary reason for Home Healthcare) MEDICAL NECESSITY: I certify, that based on my findings, NURSING services are a medically necessary home health service. HOME BOUND STATUS: I certify that my clinical findings support that this patient is homebound (i.e., Due to illness or injury, pt requires aid  of supportive devices such [name] crutches, cane, wheelchairs, walkers, the use of special transportation or the assistance of another person to leave their place of residence. There is a normal inability to leave the home and doing so requires considerable and taxing effort. Other absences are for medical reasons / religious services and are infrequent or of short duration when for other reasons). o If current dressing causes regression in wound condition, may D/C ordered dressing product/s and apply Normal Saline Moist Dressing daily until next Wound Healing Center / Other MD appointment. Notify Wound Healing Center of regression in wound condition at [phone]. o Please direct any NON-WOUND related issues/requests for orders to patient's Primary Care Physician Wound #2 Right,Lateral Lower Leg o Continue Home Health Visits o Home Health Nurse may visit PRN to address patientos wound care needs. o FACE TO FACE ENCOUNTER: MEDICARE and MEDICAID PATIENTS: I certify that this patient is under my care and that I had a face-to-face encounter that meets the physician face-to-face encounter requirements with this patient on this date. The encounter with the patient was in whole or in part for the following MEDICAL CONDITION: (primary reason for Home Healthcare) MEDICAL NECESSITY: I certify, that based on my findings, NURSING services are a  medically necessary home health service. HOME BOUND STATUS: I certify that my clinical findings support that this patient is homebound (i.e., Due to illness or injury, pt requires aid of supportive devices such [name] crutches, cane, wheelchairs, walkers, the use of special transportation or the assistance of another person to leave their place of residence. There is a normal inability to leave the home and doing so requires considerable and taxing effort. Other absences are for medical reasons / religious services and are infrequent or of short duration when for other reasons). o If current dressing causes regression in wound condition, may D/C ordered dressing product/s and apply Normal Saline Moist Dressing daily until next Wound Healing Center / Other MD appointment. Notify Wound Healing Center of regression in wound condition at [phone]. [name] [name], [name] [name] ([id number]) o Please direct any NON-WOUND related issues/requests for orders to patient's Primary Care Physician Electronic Signature(s) Signed: 06/23/2015 3:06:49 PM By: Elpidio Eric BSN, RN Signed: 06/24/2015 5:04:49 PM By: Baltazar Najjar MD Entered By: Elpidio Eric on 06/23/2015 15:06:48 [name] [name] ([id number]) -------------------------------------------------------------------------------- Problem List Details Patient Name: [name] [name] Date of Service: 06/23/2015 2:45 PM Medical Record Patient Account Number: [id number] Number: Treating RN: Clover Mealy, RN, BSN, Rita 1919/12/15 (82 y.o. 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/17/2015 Yes exposed I87.331 Chronic venous hypertension (idiopathic) with ulcer and 06/17/2015  Yes inflammation of right lower extremity Inactive Problems Resolved Problems Electronic Signature(s) Signed: 06/24/2015 5:04:49 PM By: Baltazar Najjar MD Entered By: Baltazar Najjar on 06/23/2015 15:19:08 [name] [name], [name] [name] ([id number]) -------------------------------------------------------------------------------- Progress Note Details Patient Name: [name] [name] Date of Service: 06/23/2015 2:45 PM Medical Record Patient Account Number: [id number] Number: Treating RN: Clover Mealy, RN, BSN, Rita 1919-11-21 (82 y.o. Other Clinician: Date of Birth/Sex: Female) Treating ROBSON, MICHAEL Primary Care Physician/Extender: Otilio Carpen Physician: Referring Physician: Dorothy Puffer in Treatment: 0 Subjective Chief Complaint Information obtained from Patient 82 year old woman here for review of a wound on her right medial calf just above the ankle History of Present Illness (HPI) VENOUS STUDIES: ARTERIAL STUDIES: ABIs in  this clinic noncompressible CULTURES: Radiology; x-rays of the tib-fib on 05/31/15 showed no osteomyelitis no soft tissue gas CT SCAN MRI; ANTIBIOTICS PRESCRIBED; history; this is a patient who comes [place] assisted living [place] [facility] [place]. The history behind this wound is really unclear. The patient does not relate a history of trauma, states that she simply noticed that there perhaps 5 or 6 weeks ago. She was seen in the ER at [facility] on 05/31/15. X-ray of the tib-fib did not show osteomyelitis. Her blood work was reasonably unremarkable creatinine of 1.25 suggesting stage III chronic renal failure. The patient arrived in our clinic today with a remanent Unna boot per the intake nurse. The patient has a very swollen erythematous right lower leg. She tells [place] that her "right leg is always larger than the left" she is not a diabetic. Does not have a history of lower extremity wounds. There is nothing else in [facility] other  than a plain x-ray. 06/23/15; this is a elderly woman that I saw last week for a fairly substantial and irregular wound on the medial aspect of her right leg just above the ankle. I was concerned last week that she could have an acute DVT and sent her over to vascular surgery. This was negative. I also wanted arterial Dopplers but I don't think these have been done. She lives in an assisted living in [place] and I think a home health company is changing her dressing. She complains of a lot of pain in this area. Again she arrives with only a driver, the exact history of this wound other than what I was able to get last week is really unclear. Objective [name], [name] ([id number]) Constitutional Vitals Time Taken: 2:39 PM, Height: 64 in, Weight: 171 lbs, BMI: 29.3, Temperature: 97.9 F, Pulse: 71 bpm, Respiratory Rate: 19 breaths/min, Blood Pressure: 150/52 mmHg. Cardiovascular I could not feel femoral pulses. Pedal pulses absent bilaterally.. Edema of the right leg greater than left. General Notes: Wound exam; the areas on the lower right medial leg just above the ankle. Again this does not have a very viable surface and even after surgical debridement there is not much in the way of bleeding or viable surface present. There is no clear evidence of surrounding infection. [name] noted above her peripheral pulses are palpable in no area in the right leg however there is no undue coldness of her foot and her capillary refill time appears normal Integumentary (Hair, Skin) Wound #1 status is Open. Original cause of wound was Trauma. The wound is located on the Right,Medial Lower Leg. The wound measures 6.5cm length x 4.5cm width x 0.4cm depth; 22.973cm^2 area and 9.189cm^3 volume. The wound is limited to skin breakdown. There is no tunneling or undermining noted. There is a large amount of serosanguineous drainage noted. The wound margin is flat and intact. There is medium (34-66%) red granulation within  the wound bed. There is a medium (34-66%) amount of necrotic tissue within the wound bed including Eschar and Adherent Slough. The periwound skin appearance exhibited: Localized Edema, Maceration, Moist, Erythema. The periwound skin appearance did not exhibit: Callus, Crepitus, Excoriation, Fluctuance, Friable, Induration, Rash, Scarring, Dry/Scaly, Atrophie Blanche, Cyanosis, Ecchymosis, Hemosiderin Staining, Mottled, Pallor, Rubor. The surrounding wound skin color is noted with erythema which is circumferential. Periwound temperature was noted [name] Hot. The periwound has tenderness on palpation. Wound #2 status is Open. Original cause of wound was Blister. The wound is located on the Right,Lateral Lower  Leg. The wound measures 1.9cm length x 1.5cm width x 0.1cm depth; 2.238cm^2 area and 0.224cm^3 volume. The wound is limited to skin breakdown. There is no tunneling or undermining noted. There is a medium amount of serosanguineous drainage noted. The wound margin is distinct with the outline attached to the wound base. There is large (67-100%) granulation within the wound bed. There is no necrotic tissue within the wound bed. The periwound skin appearance exhibited: Localized Edema, Maceration, Moist. The periwound skin appearance did not exhibit: Callus, Crepitus, Excoriation, Fluctuance, Friable, Induration, Rash, Scarring, Dry/Scaly, Atrophie Blanche, Cyanosis, Ecchymosis, Hemosiderin Staining, Mottled, Pallor, Rubor, Erythema. Periwound temperature was noted [name] Hot. The periwound has tenderness on palpation. 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 There was a Skin/Subcutaneous Tissue Debridement  ([id number]) debridement with total area of 29.25 sq cm performed by Maxwell Caul, MD. with the following instrument(s): Curette to remove Non-Viable tissue/material including Exudate, Fibrin/Slough, and Subcutaneous after achieving pain control using Lidocaine 4% Topical Solution. A time out was conducted prior to the start of the procedure. A Minimum amount of bleeding was controlled with Pressure. The procedure was tolerated well with a pain level of 0 throughout and a pain level of 0 following the procedure. Post Debridement Measurements: 6.5cm length x 4.5cm width x 0.4cm depth; 9.189cm^3 volume. Post procedure Diagnosis Wound #1: Same [name] Pre-Procedure Plan Wound Cleansing: Wound #1 Right,Medial Lower Leg: Clean wound with Normal Saline. Wound #2 Right,Lateral Lower Leg: Clean wound with Normal Saline. Anesthetic: Wound #1 Right,Medial Lower Leg: Topical Lidocaine 4% cream applied to wound bed prior to debridement Wound #2 Right,Lateral Lower Leg: Topical Lidocaine 4% cream applied to wound bed prior to debridement Primary Wound Dressing: Wound #1 Right,Medial Lower Leg: Aquacel Ag Wound #2 Right,Lateral Lower Leg: Aquacel Ag Secondary Dressing: Wound #1 Right,Medial Lower Leg: Gauze and Kerlix/Conform Wound #2 Right,Lateral Lower Leg: Gauze and Kerlix/Conform Dressing Change Frequency: Wound #1 Right,Medial Lower Leg: Change dressing every other day. Wound #2 Right,Lateral Lower Leg: [name], [name] ([id number]) Change dressing every other day. Follow-up Appointments: Wound #1 Right,Medial Lower Leg: Return Appointment in 1 week. Wound #2 Right,Lateral Lower Leg: Return Appointment in 1 week. Home Health: Wound #1 Right,Medial Lower Leg: Continue Home Health Visits Home Health Nurse may visit PRN to address patient s wound care needs. FACE TO FACE ENCOUNTER: MEDICARE and MEDICAID PATIENTS: I certify that this patient is under my care and that I had a face-to-face  encounter that meets the physician face-to-face encounter requirements with this patient on this date. The encounter with the patient was in whole or in part for the following MEDICAL CONDITION: (primary reason for Home Healthcare) MEDICAL NECESSITY: I certify, that based on my findings, NURSING services are a medically necessary home health service. HOME BOUND STATUS: I certify that my clinical findings support that this patient is homebound (i.e., Due to illness or injury, pt requires aid of supportive devices such [name] crutches, cane, wheelchairs, walkers, the use of special transportation or the assistance of another person to leave their place of residence. There is a normal inability to leave the home and doing so requires considerable and taxing effort. Other absences are for medical reasons / religious services and are infrequent or of short duration when  for other reasons). If current dressing causes regression in wound condition, may D/C ordered dressing product/s and apply Normal Saline Moist Dressing daily until next Wound Healing Center / Other MD appointment. Notify Wound Healing Center of regression in wound condition at [phone]. Please direct any NON-WOUND related issues/requests for orders to patient's Primary Care Physician Wound #2 Right,Lateral Lower Leg: Continue Home Health Visits Home Health Nurse may visit PRN to address patient s wound care needs. FACE TO FACE ENCOUNTER: MEDICARE and MEDICAID PATIENTS: I certify that this patient is under my care and that I had a face-to-face encounter that meets the physician face-to-face encounter requirements with this patient on this date. The encounter with the patient was in whole or in part for the following MEDICAL CONDITION: (primary reason for Home Healthcare) MEDICAL NECESSITY: I certify, that based on my findings, NURSING services are a medically necessary home health service. HOME BOUND STATUS: I certify that my clinical  findings support that this patient is homebound (i.e., Due to illness or injury, pt requires aid of supportive devices such [name] crutches, cane, wheelchairs, walkers, the use of special transportation or the assistance of another person to leave their place of residence. There is a normal inability to leave the home and doing so requires considerable and taxing effort. Other absences are for medical reasons / religious services and are infrequent or of short duration when for other reasons). If current dressing causes regression in wound condition, may D/C ordered dressing product/s and apply Normal Saline Moist Dressing daily until next Wound Healing Center / Other MD appointment. Notify Wound Healing Center of regression in wound condition at [phone]. Please direct any NON-WOUND related issues/requests for orders to patient's Primary Care Physician [place], [place] ([id number]) Very cpncerning wound on the right medial lower leg. I suspect mixed arterial and venous issues. She needs arterial dopplers which have been arranged. We have changed to Aquacel ag this week, kerlix and Conform. [facility] is changing 2x before she returns to [place] next week Electronic Signature(s) Signed: 06/24/2015 5:04:49 PM By: Baltazar Najjar MD Entered By: Baltazar Najjar on 06/23/2015 15:35:45 [place], Aidah ([id number]) -------------------------------------------------------------------------------- SuperBill Details Patient Name: [name] [name] Date of Service: 06/23/2015 Medical Record Patient Account Number: [id number] Number: Treating RN: Clover Mealy, RN, BSN, Rita 01/07/1920 (82 y.o. Other Clinician: Date of Birth/Sex: Female) Treating ROBSON, MICHAEL Primary Care Physician/Extender: Otilio Carpen Physician: Weeks in Treatment: 0 Referring Physician: Alferd Patee Diagnosis Coding ICD-10 Codes Code Description [phone] Non-pressure chronic ulcer of right calf with fat layer exposed Chronic  venous hypertension (idiopathic) with ulcer and inflammation of right lower I87.331 extremity Facility Procedures CPT4 Code: 32440102 Description: 11042 - DEB SUBQ TISSUE 20 SQ CM/< ICD-10 Description Diagnosis L97.212 Non-pressure chronic ulcer of right calf with fat Modifier: layer exposed Quantity: 1 CPT4 Code: [id number] Description: 11045 - DEB SUBQ TISS EA ADDL 20CM ICD-10 Description Diagnosis L97.212 Non-pressure chronic ulcer of right calf with fat Modifier: layer exposed Quantity: 1 Physician Procedures CPT4 Code: [id number] Description: 11042 - WC PHYS SUBQ TISS 20 SQ CM ICD-10 Description Diagnosis L97.212 Non-pressure chronic ulcer of right calf with fat l Modifier: ayer exposed Quantity: 1 CPT4 Code: [id number] Description: 11045 - WC PHYS SUBQ TISS EA ADDL 20 CM ICD-10 Description Diagnosis L97.212 Non-pressure chronic ulcer of right calf with fat l Modifier: ayer exposed Quantity: 1 Electronic Signature(s) KIMA, MALENFANT ([id number]) Signed: 06/24/2015 5:04:49 PM By: Baltazar Najjar MD Entered By: Baltazar Najjar on 06/23/2015 15:36:34

## 2015-07-01 ENCOUNTER — Ambulatory Visit: Payer: Medicare Other | Admitting: Internal Medicine

## 2015-07-08 ENCOUNTER — Ambulatory Visit: Payer: Medicare Other | Admitting: Internal Medicine

## 2015-07-15 ENCOUNTER — Ambulatory Visit: Payer: Medicare Other | Admitting: Internal Medicine

## 2015-07-22 ENCOUNTER — Ambulatory Visit: Payer: Medicare Other | Admitting: Internal Medicine

## 2015-07-29 ENCOUNTER — Ambulatory Visit: Payer: Medicare Other | Admitting: Internal Medicine

## 2015-08-05 ENCOUNTER — Ambulatory Visit: Payer: Medicare Other | Admitting: Internal Medicine

## 2017-02-06 DEATH — deceased

## 2017-07-06 IMAGING — CR DG CHEST 2V
1 series · 2 of 2 positions shown · non-contrast
Comparison: None.

CLINICAL DATA: Altered mental status for 1 week. Question
pneumonia.

EXAM:
CHEST  2 VIEW

[Series 1: dg chest 2 view · 0.14mm/px · 2 of 2 slices shown]
[im 1/2]
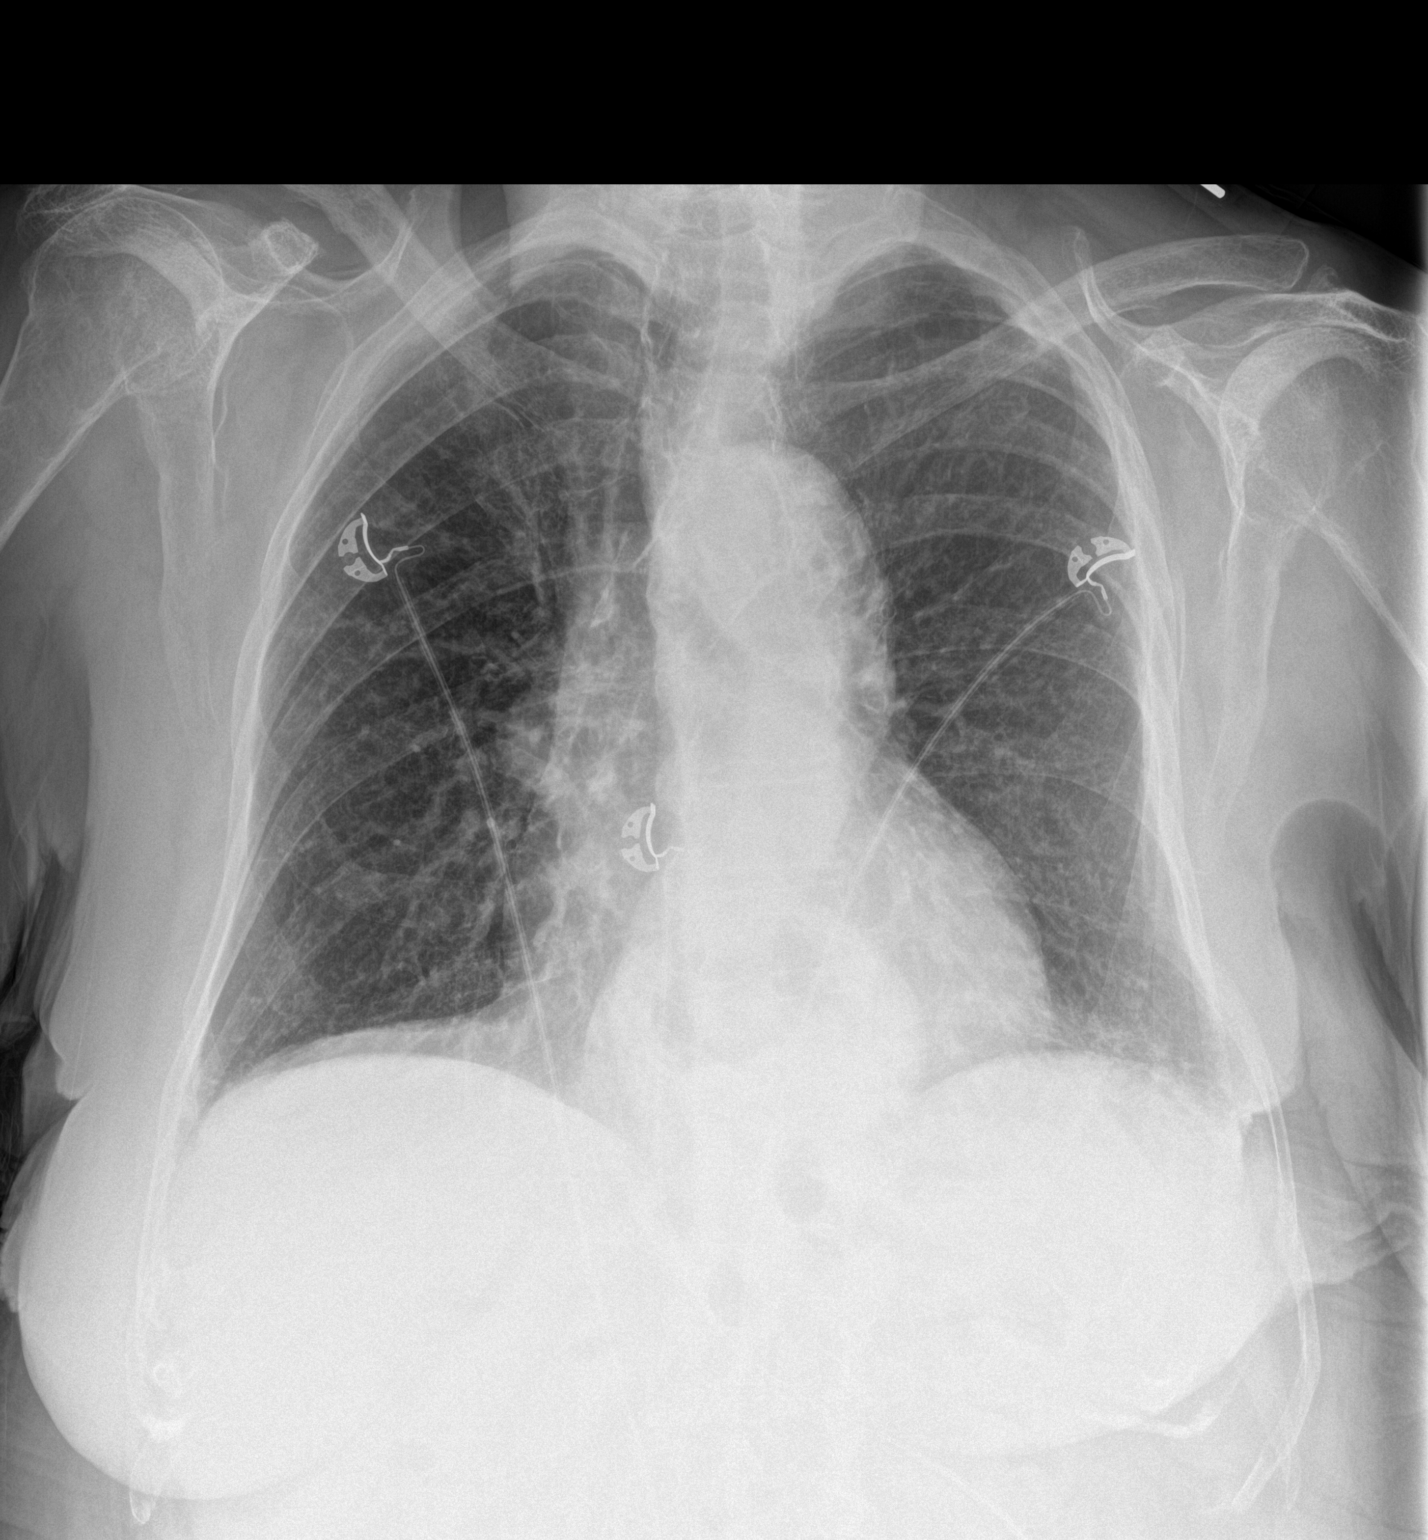
[im 2/2]
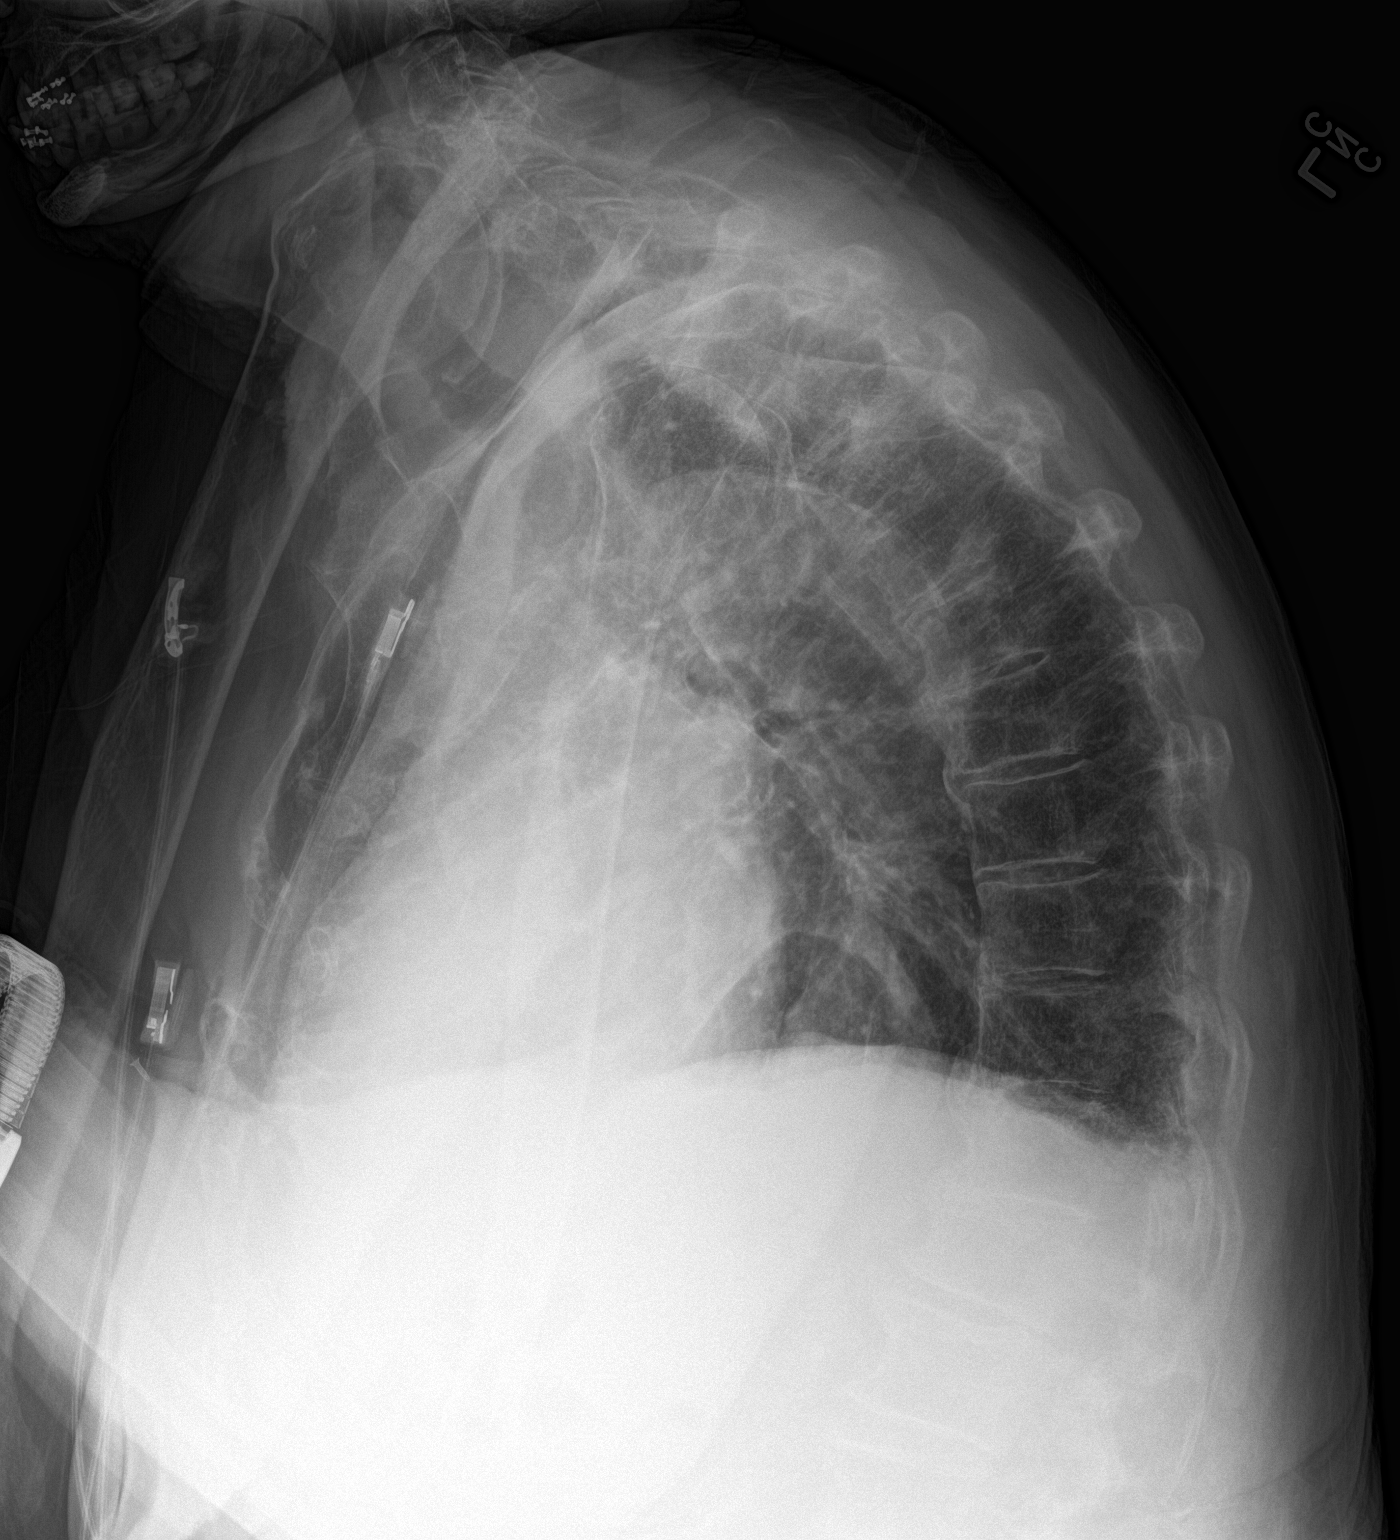

[2 of 2 positions shown; findings below may reference images not displayed]

FINDINGS: The lungs are clear. Heart size is normal. No pneumothorax or
pleural effusion. Hiatal hernia is noted.
IMPRESSION: No acute disease.

Hiatal hernia.
